# Patient Record
Sex: Female | Born: 2000 | Race: Black or African American | Hispanic: No | Marital: Single | Smoking: Never smoker
Health system: Southern US, Community
[De-identification: ages and names within clinical notes are randomized; demographics above are authoritative.]

## PROBLEM LIST (undated history)

## (undated) DIAGNOSIS — T7840XA Allergy, unspecified, initial encounter: Secondary | ICD-10-CM

## (undated) DIAGNOSIS — J020 Streptococcal pharyngitis: Secondary | ICD-10-CM

## (undated) DIAGNOSIS — L509 Urticaria, unspecified: Secondary | ICD-10-CM

## (undated) DIAGNOSIS — L309 Dermatitis, unspecified: Secondary | ICD-10-CM

## (undated) HISTORY — DX: Dermatitis, unspecified: L30.9

## (undated) HISTORY — DX: Urticaria, unspecified: L50.9

## (undated) HISTORY — DX: Allergy, unspecified, initial encounter: T78.40XA

---
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
## 2019-12-25 ENCOUNTER — Ambulatory Visit: Payer: Medicaid Other | Attending: Internal Medicine

## 2019-12-25 DIAGNOSIS — Z23 Encounter for immunization: Secondary | ICD-10-CM

## 2019-12-25 NOTE — Progress Notes (Signed)
   Covid-19 Vaccination Clinic  Name:  Lisa Brandt    MRN: [id number] DOB: 09-07-2000  12/25/2019  [name] was observed post Covid-19 immunization for 15 minutes without incident. She was provided with Vaccine Information Sheet and instruction to access the V-Safe system.   [name] was instructed to call 911 with any severe reactions post vaccine: Marland Kitchen Difficulty breathing  . Swelling of face and throat  . A fast heartbeat  . A bad rash all over body  . Dizziness and weakness   Immunizations Administered    Name Date Dose VIS Date Route   Pfizer COVID-19 Vaccine 12/25/2019 11:40 AM 0.3 mL 05/09/2018 Intramuscular   Manufacturer: [employer], [employer]   Lot: [id number]   NDC: [address]-8

## 2020-01-15 ENCOUNTER — Ambulatory Visit: Payer: Medicaid Other | Attending: Internal Medicine

## 2020-01-15 DIAGNOSIS — Z23 Encounter for immunization: Secondary | ICD-10-CM

## 2020-01-15 NOTE — Progress Notes (Signed)
   Covid-19 Vaccination Clinic  Name:  Appolonia Ackert    MRN: [id number] DOB: [birth year]  01/15/2020  Ms. Brienza was observed post Covid-19 immunization for 30 minutes based on pre-vaccination screening without incident. She was provided with Vaccine Information Sheet and instruction to access the V-Safe system.   Ms. Truax was instructed to call 911 with any severe reactions post vaccine: Marland Kitchen Difficulty breathing  . Swelling of face and throat  . A fast heartbeat  . A bad rash all over body  . Dizziness and weakness   Immunizations Administered    Name Date Dose VIS Date Route   Pfizer COVID-19 Vaccine 01/15/2020  2:21 PM 0.3 mL 01/02/2020 Intramuscular   Manufacturer: [employer], [employer]   Lot: [id number]   NDC: [address]-6

## 2020-01-15 NOTE — Progress Notes (Signed)
Patient

## 2020-02-28 DIAGNOSIS — J36 Peritonsillar abscess: Secondary | ICD-10-CM

## 2020-02-28 HISTORY — DX: Peritonsillar abscess: J36

## 2020-03-30 ENCOUNTER — Observation Stay (HOSPITAL_COMMUNITY)
Admission: EM | Admit: 2020-03-30 | Discharge: 2020-03-31 | Disposition: A | Discharge status: Home / Self Care | Payer: Medicaid Other | Attending: Internal Medicine | Admitting: Internal Medicine

## 2020-03-30 ENCOUNTER — Encounter (HOSPITAL_COMMUNITY): Payer: Self-pay | Admitting: Emergency Medicine

## 2020-03-30 ENCOUNTER — Emergency Department (HOSPITAL_COMMUNITY)
Admission: EM | Admit: 2020-03-30 | Discharge: 2020-03-30 | Disposition: A | Discharge status: Home / Self Care | Payer: Medicaid Other | Source: Home / Self Care

## 2020-03-30 ENCOUNTER — Ambulatory Visit (INDEPENDENT_AMBULATORY_CARE_PROVIDER_SITE_OTHER)
Admission: EM | Admit: 2020-03-30 | Discharge: 2020-03-30 | Disposition: A | Discharge status: Nursing Home (Includes ICF) | Payer: Medicaid Other | Source: Home / Self Care

## 2020-03-30 ENCOUNTER — Other Ambulatory Visit: Payer: Self-pay

## 2020-03-30 ENCOUNTER — Emergency Department (HOSPITAL_COMMUNITY): Payer: Medicaid Other

## 2020-03-30 ENCOUNTER — Encounter (HOSPITAL_COMMUNITY): Payer: Self-pay | Admitting: *Deleted

## 2020-03-30 DIAGNOSIS — Z20822 Contact with and (suspected) exposure to covid-19: Secondary | ICD-10-CM | POA: Diagnosis not present

## 2020-03-30 DIAGNOSIS — J36 Peritonsillar abscess: Secondary | ICD-10-CM

## 2020-03-30 DIAGNOSIS — Z5321 Procedure and treatment not carried out due to patient leaving prior to being seen by health care provider: Secondary | ICD-10-CM | POA: Insufficient documentation

## 2020-03-30 DIAGNOSIS — J029 Acute pharyngitis, unspecified: Secondary | ICD-10-CM | POA: Insufficient documentation

## 2020-03-30 HISTORY — DX: Streptococcal pharyngitis: J02.0

## 2020-03-30 LAB — CBC WITH DIFFERENTIAL/PLATELET
Abs Immature Granulocytes: 0.07 10*3/uL (ref 0.00–0.07)
Basophils Absolute: 0 10*3/uL (ref 0.0–0.1)
Basophils Relative: 0 %
Eosinophils Absolute: 0 10*3/uL (ref 0.0–0.5)
Eosinophils Relative: 0 %
HCT: 42.7 % (ref 36.0–46.0)
Hemoglobin: 13.7 g/dL (ref 12.0–15.0)
Immature Granulocytes: 0 %
Lymphocytes Relative: 6 %
Lymphs Abs: 1 10*3/uL (ref 0.7–4.0)
MCH: 26.2 pg (ref 26.0–34.0)
MCHC: 32.1 g/dL (ref 30.0–36.0)
MCV: 81.8 fL (ref 80.0–100.0)
Monocytes Absolute: 1.2 10*3/uL — ABNORMAL HIGH (ref 0.1–1.0)
Monocytes Relative: 8 %
Neutro Abs: 13.9 10*3/uL — ABNORMAL HIGH (ref 1.7–7.7)
Neutrophils Relative %: 86 %
Platelets: 266 10*3/uL (ref 150–400)
RBC: 5.22 MIL/uL — ABNORMAL HIGH (ref 3.87–5.11)
RDW: 14.1 % (ref 11.5–15.5)
WBC: 16.3 10*3/uL — ABNORMAL HIGH (ref 4.0–10.5)
nRBC: 0 % (ref 0.0–0.2)

## 2020-03-30 LAB — BASIC METABOLIC PANEL
Anion gap: 12 (ref 5–15)
BUN: <5 mg/dL — ABNORMAL LOW (ref 6–20)
CO2: 23 mmol/L (ref 22–32)
Calcium: 9.2 mg/dL (ref 8.9–10.3)
Chloride: 101 mmol/L (ref 98–111)
Creatinine, Ser: 0.85 mg/dL (ref 0.44–1.00)
GFR, Estimated: >60 mL/min (ref >60)
Glucose, Bld: 112 mg/dL — ABNORMAL HIGH (ref 70–99)
Potassium: 3.5 mmol/L (ref 3.5–5.1)
Sodium: 136 mmol/L (ref 135–145)

## 2020-03-30 LAB — POCT RAPID STREP A, ED / UC: Streptococcus, Group A Screen (Direct): NEGATIVE

## 2020-03-30 LAB — GROUP A STREP BY PCR: Group A Strep by PCR: DETECTED — AB

## 2020-03-30 LAB — I-STAT BETA HCG BLOOD, ED (MC, WL, AP ONLY): I-stat hCG, quantitative: <5 m[IU]/mL (ref <5)

## 2020-03-30 MED ORDER — ENOXAPARIN SODIUM 40 MG/0.4ML ~~LOC~~ SOLN: 40.0000 mg | SUBCUTANEOUS | Status: DC

## 2020-03-30 MED ORDER — IOHEXOL 300 MG/ML  SOLN
73.0000 mL | Freq: Once | INTRAMUSCULAR | Status: AC | PRN
  Administered 2020-03-30: 73 mL via INTRAVENOUS

## 2020-03-30 MED ORDER — ACETAMINOPHEN 650 MG RE SUPP: 650.0000 mg | Freq: Four times a day (QID) | RECTAL | Status: DC | PRN

## 2020-03-30 MED ORDER — KETOROLAC TROMETHAMINE 15 MG/ML IJ SOLN: 15.0000 mg | Freq: Three times a day (TID) | INTRAMUSCULAR | Status: DC | PRN

## 2020-03-30 MED ORDER — SODIUM CHLORIDE 0.9% FLUSH
3.0000 mL | Freq: Two times a day (BID) | INTRAVENOUS | Status: DC
  Administered 2020-03-31: 3 mL via INTRAVENOUS

## 2020-03-30 MED ORDER — KETOROLAC TROMETHAMINE 30 MG/ML IJ SOLN
30.0000 mg | Freq: Once | INTRAMUSCULAR | Status: AC
  Administered 2020-03-30: 30 mg via INTRAVENOUS
  Filled 2020-03-30: qty 1

## 2020-03-30 MED ORDER — DEXAMETHASONE SODIUM PHOSPHATE 10 MG/ML IJ SOLN
10.0000 mg | Freq: Once | INTRAMUSCULAR | Status: AC
  Administered 2020-03-30: 10 mg via INTRAVENOUS
  Filled 2020-03-30: qty 1

## 2020-03-30 MED ORDER — CLINDAMYCIN PHOSPHATE 600 MG/50ML IV SOLN
600.0000 mg | Freq: Three times a day (TID) | INTRAVENOUS | Status: DC
  Administered 2020-03-31 (×2): 600 mg via INTRAVENOUS
  Filled 2020-03-30 (×2): qty 50

## 2020-03-30 MED ORDER — ACETAMINOPHEN 500 MG PO TABS
1000.0000 mg | ORAL_TABLET | Freq: Once | ORAL | Status: AC
  Administered 2020-03-30: 1000 mg via ORAL
  Filled 2020-03-30: qty 2

## 2020-03-30 MED ORDER — ACETAMINOPHEN 325 MG PO TABS: 650.0000 mg | ORAL_TABLET | Freq: Four times a day (QID) | ORAL | Status: DC | PRN

## 2020-03-30 MED ORDER — POLYETHYLENE GLYCOL 3350 17 G PO PACK: 17.0000 g | PACK | Freq: Every day | ORAL | Status: DC | PRN

## 2020-03-30 MED ORDER — ONDANSETRON HCL 4 MG PO TABS: 4.0000 mg | ORAL_TABLET | Freq: Four times a day (QID) | ORAL | Status: DC | PRN

## 2020-03-30 MED ORDER — ONDANSETRON HCL 4 MG/2ML IJ SOLN: 4.0000 mg | Freq: Four times a day (QID) | INTRAMUSCULAR | Status: DC | PRN

## 2020-03-30 MED ORDER — LIDOCAINE VISCOUS HCL 2 % MT SOLN
15.0000 mL | Freq: Once | OROMUCOSAL | Status: AC
  Administered 2020-03-30: 15 mL via OROMUCOSAL
  Filled 2020-03-30: qty 15

## 2020-03-30 MED ORDER — CLINDAMYCIN PHOSPHATE 600 MG/50ML IV SOLN
600.0000 mg | Freq: Once | INTRAVENOUS | Status: AC
  Administered 2020-03-30: 600 mg via INTRAVENOUS
  Filled 2020-03-30: qty 50

## 2020-03-30 NOTE — ED Triage Notes (Signed)
Pt presents to ED POv. Pt c/o sore throat x3d. Denies cough, fevers, SOB. Airway intact

## 2020-03-30 NOTE — ED Triage Notes (Signed)
C/o sore throat x 3 days.  Sent from [facility] for peritonsillar abscess.

## 2020-03-30 NOTE — ED Notes (Signed)
Patient transported to CT 

## 2020-03-30 NOTE — ED Triage Notes (Signed)
C/O sore throat onset 3 days ago without fevers.  Denies any other sxs.  States feels like past strep infections.

## 2020-03-30 NOTE — ED Notes (Signed)
Pt given a cup of applesauce.  

## 2020-03-30 NOTE — ED Notes (Signed)
Called for pt, no response

## 2020-03-30 NOTE — ED Provider Notes (Signed)
[facility] EMERGENCY DEPARTMENT Provider Note   CSN: [id number] Arrival date & time: 03/30/20  1106     History Chief Complaint  Patient presents with  . Sore Throat    Lisa Brandt is a 20 y.o. female.  Lisa Brandt is a 20 y.o. female with tree of strep pharyngitis, who presents to the emergency department for evaluation of 3 days of sore throat.  She initially presented to the emergency department last night but left due to long wait times, did have positive strep PCR.  She returned to urgent care today when she was having worsening throat pain and difficulty swallowing.  She reports it is very painful to swallow she has been trying to spit out her saliva.  She has been able to drink some but reports it is becoming increasingly uncomfortable.  Patient did not know that she had a fever but was found to be febrile to 101 on arrival.  She reports history of previous strep infections that have felt similar.  No prior history of peritonsillar abscess.  No associated cough, rhinorrhea, nausea or vomiting.  She was evaluated at urgent care, there was concern for increased swelling on the left side, and uvula noted to be deviated, sent to the ED with concern for peritonsillar abscess.  Patient denies any shortness of breath or difficulty breathing.  No relief with over-the-counter medications at home.        Past Medical History:  Diagnosis Date  . Strep pharyngitis     Patient Active Problem List   Diagnosis Date Noted  . Peritonsillar abscess 03/30/2020    History reviewed. No pertinent surgical history.   OB History   No obstetric history on file.     Family History  Problem Relation Age of Onset  . Healthy Mother   . Healthy Father     Social History   Tobacco Use  . Smoking status: Never Smoker  . Smokeless tobacco: Never Used  Vaping Use  . Vaping Use: Never used  Substance Use Topics  . Alcohol use: Not Currently  . Drug use: Never     Home Medications Prior to Admission medications   Medication Sig Start Date End Date Taking? Authorizing Provider  acetaminophen (TYLENOL) 325 MG tablet Take 650 mg by mouth every 6 (six) hours as needed for mild pain or headache.   Yes [provider]  ELDERBERRY PO Take 1 each by mouth in the morning, at noon, and at bedtime.   Yes [provider]  Phenylephrine-Pheniramine-DM (THERAFLU COLD & COUGH PO) Take 30 mLs by mouth 3 (three) times daily as needed (flu symptoms).   Yes [provider]    Allergies    Penicillins  Review of Systems   Review of Systems  Constitutional: Positive for chills and fever.  HENT: Positive for sore throat and trouble swallowing. Negative for congestion and rhinorrhea.   Respiratory: Negative for cough and shortness of breath.   Cardiovascular: Negative for chest pain.  Gastrointestinal: Negative for abdominal pain, nausea and vomiting.  Musculoskeletal: Negative for myalgias, neck pain and neck stiffness.  Skin: Negative for rash.  Neurological: Negative for headaches.  All other systems reviewed and are negative.   Physical Exam Updated Vital Signs BP 125/77   Pulse 68   Temp 98.9 F (37.2 C) (Oral)   Resp 16   Ht 5\' 7"  (1.702 m)   Wt 104.3 kg   LMP 03/09/2020 (Exact Date)   SpO2 99%  BMI 36.02 kg/m   Physical Exam Vitals and nursing note reviewed.  Constitutional:      General: She is not in acute distress.    Appearance: She is well-developed, normal weight and well-nourished. She is not ill-appearing or diaphoretic.     Comments: Currently well-appearing and in no acute distress.  HENT:     Head: Normocephalic and atraumatic.     Nose: No congestion or rhinorrhea.     Mouth/Throat:     Pharynx: Uvula midline. Pharyngeal swelling, oropharyngeal exudate and posterior oropharyngeal erythema present.     Tonsils: Tonsillar exudate present. 4+ on the right. 4+ on the left.     Comments: Posterior  oropharynx is erythematous with bilateral significant tonsillar edema, uvula appears midline but patient has kissing tonsils, erythema and exudates noted bilaterally.  Patient is spitting secretions into a bag but has normal phonation with no trismus.  Protecting airway. Eyes:     General:        Right eye: No discharge.        Left eye: No discharge.  Neck:     Comments: There is some mild cervical lymphadenopathy noted.  No stridor or rigidity Cardiovascular:     Rate and Rhythm: Normal rate and regular rhythm.     Heart sounds: Normal heart sounds.  Pulmonary:     Effort: Pulmonary effort is normal. No respiratory distress.     Breath sounds: Normal breath sounds.     Comments: Respirations equal and unlabored, patient able to speak in full sentences, lungs clear to auscultation bilaterally  Abdominal:     General: Bowel sounds are normal. There is no distension.     Palpations: There is no mass.  Musculoskeletal:     Cervical back: Neck supple.  Skin:    General: Skin is warm and dry.     Findings: No rash.  Neurological:     Mental Status: She is alert and oriented to person, place, and time.     Coordination: Coordination normal.  Psychiatric:        Mood and Affect: Mood and affect and mood normal.        Behavior: Behavior normal.     ED Results / Procedures / Treatments   Labs (all labs ordered are listed, but only abnormal results are displayed) Labs Reviewed  BASIC METABOLIC PANEL - Abnormal; Notable for the following components:      Result Value   Glucose, Bld 112 (*)    BUN <5 (*)    All other components within normal limits  CBC WITH DIFFERENTIAL/PLATELET - Abnormal; Notable for the following components:   WBC 16.3 (*)    RBC 5.22 (*)    Neutro Abs 13.9 (*)    Monocytes Absolute 1.2 (*)    All other components within normal limits  I-STAT BETA HCG BLOOD, ED ([facility], [facility], [facility] ONLY)    EKG None  Radiology None  Procedures Procedures (including critical  care time)  Medications Ordered in ED Medications  clindamycin (CLEOCIN) IVPB 600 mg (0 mg Intravenous Stopped 03/30/20 1806)  dexamethasone (DECADRON) injection 10 mg (10 mg Intravenous Given 03/30/20 1650)  lidocaine (XYLOCAINE) 2 % viscous mouth solution 15 mL (15 mLs Mouth/Throat Given 03/30/20 1649)  ketorolac (TORADOL) 30 MG/ML injection 30 mg (30 mg Intravenous Given 03/30/20 1650)  iohexol (OMNIPAQUE) 300 MG/ML solution 73 mL (73 mLs Intravenous Contrast Given 03/30/20 1712)  acetaminophen (TYLENOL) tablet 1,000 mg (1,000 mg Oral Given 03/30/20 1947)  ED Course  I have reviewed the triage vital signs and the nursing notes.  Pertinent labs & imaging results that were available during my care of the patient were reviewed by me and considered in my medical decision making (see chart for details).    MDM Rules/Calculators/A&P                         20 year old female sent from urgent care for evaluation for possible peritonsillar abscess.  She has had 3 days of sore throat, had positive strep test in the ED last night but did not stay for evaluation due to wait times.  Returns today with pain with swallowing, pain worse on the left side of the throat.  Urgent care was concerned for left PTA.  On my evaluation patient does have significant bilateral peritonsillar edema with kissing tonsils, exudates present bilaterally, difficulty tolerating secretions and spitting them into a bag but no stridor and is currently protecting her airway and able to talk without difficulty.  We will get basic labs and CT soft tissue neck with contrast.  We will treat with IV Toradol, Decadron, clindamycin and viscous lidocaine.  Lab work has been reviewed.  Significant for leukocytosis of 16.3, glucose of 112 but no other significant electrolyte derangements, negative pregnancy.  At shift change CT soft tissue neck is still pending, care signed out to PA Graciella Freer who will follow-up on CT results, if this  does show peritonsillar abscess will need ENT consult, could also be severe bilateral tonsillitis.  PA Layden we will follow-up on pending results and disposition appropriately  Final Clinical Impression(s) / ED Diagnoses Final diagnoses:  Strep pharangitis    Rx / DC Orders ED Discharge Orders    None       Dartha Lodge, PA-C 03/31/20 0001    Virgina Norfolk, DO 03/31/20 1558

## 2020-03-30 NOTE — ED Provider Notes (Signed)
[facility]    CSN: [id number] Arrival date & time: 03/30/20  1009      History   Chief Complaint Chief Complaint  Patient presents with  . Sore Throat    HPI Lisa Brandt is a 20 y.o. female presenting for sore throat for 3 days. States significant pain at rest and with swallowing. Denies shortness of breath, congestion, cough, difficulty swallowing, fevers, n/v/d, ear pain, etc.  States she had a negative covid test at onset of symptoms. Has been trying over the counter medications with no relief  States she's concerned for strep throat  HPI  Past Medical History:  Diagnosis Date  . Strep pharyngitis     There are no problems to display for this patient.   History reviewed. No pertinent surgical history.  OB History   No obstetric history on file.      Home Medications    Prior to Admission medications   Not on File    Family History Family History  Problem Relation Age of Onset  . Healthy Mother   . Healthy Father     Social History Social History   Tobacco Use  . Smoking status: Never Smoker  . Smokeless tobacco: Never Used  Vaping Use  . Vaping Use: Never used  Substance Use Topics  . Alcohol use: Not Currently  . Drug use: Never     Allergies   Penicillins   Review of Systems Review of Systems  Constitutional: Negative for appetite change, chills and fever.  HENT: Positive for sore throat. Negative for congestion, ear pain, rhinorrhea, sinus pressure and sinus pain.   Eyes: Negative for redness and visual disturbance.  Respiratory: Negative for cough, chest tightness, shortness of breath and wheezing.   Cardiovascular: Negative for chest pain and palpitations.  Gastrointestinal: Negative for abdominal pain, constipation, diarrhea, nausea and vomiting.  Genitourinary: Negative for dysuria, frequency and urgency.  Musculoskeletal: Negative for myalgias.  Neurological: Negative for dizziness, weakness and headaches.   Psychiatric/Behavioral: Negative for confusion.  All other systems reviewed and are negative.    Physical Exam Triage Vital Signs ED Triage Vitals  Enc Vitals Group     BP      Pulse      Resp      Temp      Temp src      SpO2      Weight      Height      Head Circumference      Peak Flow      Pain Score      Pain Loc      Pain Edu?      Excl. in GC?    No data found.  Updated Vital Signs BP (!) 155/88   Pulse (!) 103   Temp 98.9 F (37.2 C) (Oral)   Resp 16   LMP 03/09/2020 (Exact Date)   SpO2 98%   Visual Acuity Right Eye Distance:   Left Eye Distance:   Bilateral Distance:    Right Eye Near:   Left Eye Near:    Bilateral Near:     Physical Exam Vitals reviewed.  Constitutional:      General: She is not in acute distress.    Appearance: Normal appearance. She is not ill-appearing.  HENT:     Head: Normocephalic and atraumatic.     Right Ear: Hearing, tympanic membrane, ear canal and external ear normal. No swelling or tenderness. There is no impacted cerumen. No mastoid  tenderness. Tympanic membrane is not perforated, erythematous, retracted or bulging.     Left Ear: Hearing, tympanic membrane, ear canal and external ear normal. No swelling or tenderness. There is no impacted cerumen. No mastoid tenderness. Tympanic membrane is not perforated, erythematous, retracted or bulging.     Nose:     Right Sinus: No maxillary sinus tenderness or frontal sinus tenderness.     Left Sinus: No maxillary sinus tenderness or frontal sinus tenderness.     Mouth/Throat:     Mouth: Mucous membranes are moist.     Pharynx: Pharyngeal swelling and posterior oropharyngeal erythema present. No oropharyngeal exudate.     Tonsils: No tonsillar exudate.     Comments: L posterior pharynx with erythema and swelling. Uvula not midline.  Cardiovascular:     Rate and Rhythm: Normal rate and regular rhythm.     Heart sounds: Normal heart sounds.  Pulmonary:     Breath sounds:  Normal breath sounds and air entry. No wheezing, rhonchi or rales.  Chest:     Chest wall: No tenderness.  Abdominal:     General: Abdomen is flat. Bowel sounds are normal.     Tenderness: There is no abdominal tenderness. There is no guarding or rebound.  Lymphadenopathy:     Cervical: No cervical adenopathy.  Neurological:     General: No focal deficit present.     Mental Status: She is alert and oriented to person, place, and time.  Psychiatric:        Attention and Perception: Attention and perception normal.        Mood and Affect: Mood and affect normal.        Behavior: Behavior normal. Behavior is cooperative.        Thought Content: Thought content normal.        Judgment: Judgment normal.      UC Treatments / Results  Labs (all labs ordered are listed, but only abnormal results are displayed) Labs Reviewed  CULTURE, GROUP A STREP [facility])  POCT RAPID STREP A, ED / UC    EKG   Radiology No results found.  Procedures Procedures (including critical care time)  Medications Ordered in UC Medications - No data to display  Initial Impression / Assessment and Plan / UC Course  I have reviewed the triage vital signs and the nursing notes.  Pertinent labs & imaging results that were available during my care of the patient were reviewed by me and considered in my medical decision making (see chart for details).     Head straight to [facility] ED for further evaluation of peritonsillar abscess. Pt is hemodynamically stable to transport herself at this time. She verbalizes that she will head straight there. She understands to call EMS if shortness of breath, chest pain, syncope, etc while on her way to ED  Rapid strep negative. Culture sent, negative. Strep PCR performed by hospital was positive for strep. This patient was admitted to the hospital for overnight observation. Patient declines covid test stating that she had a negative covid test 3 days ago    Final  Clinical Impressions(s) / UC Diagnoses   Final diagnoses:  Peritonsillar abscess     Discharge Instructions     -Head straight to [facility] ER    ED Prescriptions    None     PDMP not reviewed this encounter.   Rhys Martini, PA-C 04/01/20 [phone]

## 2020-03-30 NOTE — Consult Note (Signed)
Reason for Consult: Peritonsillar abscess Referring Physician: ER  Lisa Brandt is an 20 y.o. female.  HPI: History of sore throat since Thursday.  She has had increasing symptoms beginning yesterday.  She still able to swallow.  She has been in the emergency room and had antibiotics and already feels better.  Her voice is much less muffled.  She had no airway problems.  She has no chronic tonsillitis issues.  No previous peritonsillar abscess.  CT scan shows two small areas in the left tonsil of suspected abscess.  Past Medical History:  Diagnosis Date  . Strep pharyngitis     History reviewed. No pertinent surgical history.  Family History  Problem Relation Age of Onset  . Healthy Mother   . Healthy Father     Social History:  reports that she has never smoked. She has never used smokeless tobacco. She reports previous alcohol use. She reports that she does not use drugs.  Allergies:  Allergies  Allergen Reactions  . Penicillins Hives    Medications: I have reviewed the patient's current medications.  Results for orders placed or performed during the hospital encounter of 03/30/20 (from the past 48 hour(s))  Basic metabolic panel     Status: Abnormal   Collection Time: 03/30/20  3:44 PM  Result Value Ref Range   Sodium 136 135 - 145 mmol/L   Potassium 3.5 3.5 - 5.1 mmol/L   Chloride 101 98 - 111 mmol/L   CO2 23 22 - 32 mmol/L   Glucose, Bld 112 (H) 70 - 99 mg/dL    Comment: Glucose reference range applies only to samples taken after fasting for at least 8 hours.   BUN <5 (L) 6 - 20 mg/dL   Creatinine, Ser 1.51 0.44 - 1.00 mg/dL   Calcium 9.2 8.9 - 76.1 mg/dL   GFR, Estimated >60 >73 mL/min    Comment: (NOTE) Calculated using the CKD-EPI Creatinine Equation (2021)    Anion gap 12 5 - 15    Comment: Performed at [facility], [address] [place], [place] [address]  CBC with Differential     Status: Abnormal   Collection Time: 03/30/20  3:44 PM  Result  Value Ref Range   WBC 16.3 (H) 4.0 - 10.5 K/uL   RBC 5.22 (H) 3.87 - 5.11 MIL/uL   Hemoglobin 13.7 12.0 - 15.0 g/dL   HCT 69.4 85.4 - 62.7 %   MCV 81.8 80.0 - 100.0 fL   MCH 26.2 26.0 - 34.0 pg   MCHC 32.1 30.0 - 36.0 g/dL   RDW 03.5 00.9 - 38.1 %   Platelets 266 150 - 400 K/uL   nRBC 0.0 0.0 - 0.2 %   Neutrophils Relative % 86 %   Neutro Abs 13.9 (H) 1.7 - 7.7 K/uL   Lymphocytes Relative 6 %   Lymphs Abs 1.0 0.7 - 4.0 K/uL   Monocytes Relative 8 %   Monocytes Absolute 1.2 (H) 0.1 - 1.0 K/uL   Eosinophils Relative 0 %   Eosinophils Absolute 0.0 0.0 - 0.5 K/uL   Basophils Relative 0 %   Basophils Absolute 0.0 0.0 - 0.1 K/uL   Immature Granulocytes 0 %   Abs Immature Granulocytes 0.07 0.00 - 0.07 K/uL    Comment: Performed at [facility], [address], [place], [place] [address]  I-Stat beta hCG blood, ED     Status: None   Collection Time: 03/30/20  3:55 PM  Result Value Ref Range  I-stat hCG, quantitative <5.0 <5 mIU/mL   Comment 3            Comment:   GEST. AGE      CONC.  (mIU/mL)   <=1 WEEK        5 - 50     2 WEEKS       50 - 500     3 WEEKS       100 - 10,000     4 WEEKS     1,000 - 30,000        FEMALE AND NON-PREGNANT FEMALE:     LESS THAN 5 mIU/mL     CT Soft Tissue Neck W Contrast  Result Date: 03/30/2020 CLINICAL DATA:  Tonsil/adenoid disorder. Question peritonsillar abscess. EXAM: CT NECK WITH CONTRAST TECHNIQUE: Multidetector CT imaging of the neck was performed using the standard protocol following the bolus administration of intravenous contrast. CONTRAST:  34mL OMNIPAQUE IOHEXOL 300 MG/ML  SOLN COMPARISON:  None. FINDINGS: Pharynx and larynx: Bilateral tonsillar enlargement and striated/heterogeneous enhancement, compatible with tonsillitis. There is a 1.0 by 1.7 by 1.3 cm left peritonsillar fluid collection, compatible with abscess (see series 3, image 48 and series 7, image 59). There is an adjacent smaller 6 mm fluid collection more anteriorly and  superiorly, compatible with an additional abscess (see series 3, image 43). Stranding/edema extends inferiorly to the base of the epiglottis, involving the left area epiglottic fold. Edema extends laterally to involve the left parapharyngeal space and left submandibular space. Change in there is associated mass effect on the airway, which remains patent. Prominent adenoid soft tissue, most likely adenoid hypertrophy. Salivary glands: Inflammatory changes from the above process extending to involve the left submandibular space and surround the left submandibular gland. The submandibular gland itself appears unremarkable and symmetric. The parotid glands are normal. Thyroid: Normal. Lymph nodes: Bilateral upper cervical lymphadenopathy without specific evidence of suppuration. Vascular: Grossly patent. Limited intracranial: Negative. Visualized orbits: Negative. Mastoids and visualized paranasal sinuses: Mucosal thickening of the inferior right maxillary sinus. Remaining sinuses are largely clear. No mastoid effusions. Skeleton: No acute or aggressive process. Upper chest: Negative. IMPRESSION: 1. Findings consistent with tonsillitis with two left peritonsillar abscesses, as detailed above. Edema extends inferiorly to involve the base of the left epiglottis/aryepiglottic fold and inferolaterally to involve the parapharyngeal and submandibular spaces. Associated mass effect on the pharyngeal airway, which remains patent. 2. Cervical lymphadenopathy without evidence of suppuration. 3. Prominent adenoid soft tissue. This likely represents adenoid hypertrophy; however, recommend correlation with direct inspection. Electronically Signed   By: Feliberto Harts MD   On: 03/30/2020 17:31    ROS Blood pressure 123/70, pulse 81, temperature (!) 101 F (38.3 C), temperature source Oral, resp. rate 18, height 5\' 7"  (1.702 m), weight 104.3 kg, last menstrual period 03/09/2020, SpO2 97 %. Physical Exam Constitutional:       Appearance: She is well-developed.  HENT:     Head: Normocephalic.     Comments: She is awake and alert.  No distress.  No stridor.  She has no trismus.  She is handling her secretions well.  Nose is clear.  Oral cavity/oropharynx-both tonsils have thick exudate on the surface.  The left side has a slight more erythema to the peritonsillar region but no significant bulging.  She opens her mouth well.  She has a normal sounding voice.  Neck is without swelling or tenderness      Assessment/Plan: Left peritonsillar abscess/acute tonsillitis-she has bilateral tonsillitis.  With  thick exudate.  There is a fluid collection on CT scan on the left side.  She seems to have no trismus, no airway problems, seems to be comfortable with no significant pain, and already feels better since she started treatment in the emergency room.  I would recommend medical therapy for now and see if she continues to improve.  I will follow-up with her in the morning to make a plan for incision and drainage or discharge.  Suzanna Obey 03/30/2020, 6:58 PM

## 2020-03-30 NOTE — ED Provider Notes (Signed)
  Care assumed from  [facility] PA-C at shift change with CT soft tissue neck pending.   In brief, this patient is a 20 year old female who presents for evaluation of 3 days of sore throat.  She reports that initially she woke up and thought that her throat was just scratchy.  She came to the ER last night when symptoms were getting worse.  She left before being seen.  This morning, she woke up and said that it felt worse, she said it hurt when she swallows her saliva and she is having to spit it out.  She went to urgent care was sent to the ED for further evaluation.  No difficulty breathing.   Physical Exam  BP 127/77   Pulse 81   Temp (!) 101 F (38.3 C) (Oral)   Resp 16   Ht 5\' 7"  (1.702 m)   Wt 104.3 kg   LMP 03/09/2020 (Exact Date)   SpO2 98%   BMI 36.02 kg/m   Physical Exam  Muffled phonation.  She is intermittently spitting into a bag but no active drooling.  No evidence of respiratory distress. Posterior oropharynx is erythematous.  Uvula is deviated to the right.  There is evidence of tonsillar edema, erythema noted on the left.  ED Course/Procedures     Procedures  MDM     PLAN: Patient has been given 10 of Decadron, 30 of Toradol, clindamycin.  She is pending a CT soft tissue neck for evaluation of peritonsillar abscess.  MDM: CBC shows leukocytosis of 16.3.  BUN and creatinine within normal limits.  I-STAT beta negative.   Review of records show that she did have a strep done yesterday that was positive.  CT scan shows tonsillitis with 2 left peritonsillar abscesses.  She has 1 that is 1 x 1.7 x 1.3 cm and then another one that is about 6 mm.  She has edema that extends inferiorly to the base of epiglottis involving the left area epiglottic fold.  Edema extends laterally to involve the left pharyngeal space and submandibular space.  Change and there is associated mass-effect on the airway which remains patent.  Discussed patient with Dr. 03/11/2020 (ENT) who  reviewed patient's scans. He will come evaluate the patient to see if the abscess needs to be drained. He recommends that given the edema, that patient be admitted overnight for observation.   Discussed patient with Dr. Jearld Fenton (ENT) after evaluation. He will hold off on draining the abscesses now. Wil treat with IV meds/abx. He recommends observation and he will consult on patient tomorrow.   Discussed with internal medicine team who accepts patient for admission.   1. Peritonsillar abscess     Portions of this note were generated with Dragon dictation software. Dictation errors may occur despite best attempts at proofreading.    Jearld Fenton, PA-C 03/30/20 1959    04/01/20, DO 03/31/20 1559

## 2020-03-30 NOTE — ED Notes (Signed)
Called for repeat VS, no response. 

## 2020-03-30 NOTE — Discharge Instructions (Addendum)
-  Head straight to [facility] ER

## 2020-03-30 NOTE — H&P (Addendum)
Date: 03/30/2020               Patient Name:  Lisa Brandt MRN: [id number]  DOB: [birth year] Age / Sex: 20 y.o., female   PCP: Patient, No Pcp Per         Medical Service: Internal Medicine Teaching Service         Attending Physician: Dr. Gust Rung, DO    First Contact: Dr. Alphonzo Severance Pager: [phone]  Second Contact: Dr. Dolan Amen Pager: [phone]       After Hours (After 5p/  First Contact Pager: [phone]  weekends / holidays): Second Contact Pager: [phone]   Chief Complaint: sore throat  History of Present Illness:   Lisa Brandt is a 20 year old female with no pertinent past medical history who presents for evaluation of sore throat x3 days. Reports that she woke up 4 days ago and felt her throat was scratchy and this progressively worsened to a sore throat that she states feels like prior strep throat infections. Tried OTC medications, but with minimal relief. Symptoms became severely worse yesterday and she came to the ED to be tested for strep, but wait was very long, so she went to urgent care. At the urgent care, she was told she had an abscess and sent back to the ED. She is having pain with swallowing but no difficulty. She denies any trouble breathing, cough, ear pain, N/V/D, abdominal pain.   No fever at home but was febrile on arrival.   Presented to the ER yesterday night after worsening of symptoms, but left before she was seen. This morning, she reports waking up with worsening symptoms and also began having pain with swallowing her saliva. She went to the [facility] and was sent to the ED for further evaluation of possible peritonsillar abscess.   She denies SHOB, cough, congestion, subjective fevers, nausea, vomiting, and diarrhea. She does report having a negative COVID test after the onset of symptoms.   ED course: CBC showing leukocytosis to 16.3, BMP unremarkable, I-stat hCG negative. Group A Strep by PCR is positive. CT soft tissue neck showing  tonsillitis with two left peritonsillar abscesses with edema extending inferiorly to involve the base of the left epiglottic fold and inferolaterally to involve the left pharyngeal space and submandibular space, with associated mass-effect on the airway which remains patent. Given Decadron 10mg , toradol 30mg , lidocaine mouth solution, one dose of clindamycin. ENT, Dr. , consulted for further evaluation, will hold off on draining for now and will treat with IV abx. IMTS consulted to admit for overnight observation.    Meds:  Current Meds  Medication Sig  . acetaminophen (TYLENOL) 325 MG tablet Take 650 mg by mouth every 6 (six) hours as needed for mild pain or headache.  ELDERBERRY PO Take 1 each by mouth in the morning, at noon, and at bedtime.  Jearld Fenton Phenylephrine-Pheniramine-DM (THERAFLU COLD & COUGH PO) Take 30 mLs by mouth 3 (three) times daily as needed (flu symptoms).     Allergies: Allergies as of 03/30/2020 - Review Complete 03/30/2020  Allergen Reaction Noted  . Penicillins Hives 01/15/2020   Past Medical History:  Diagnosis Date  . Strep pharyngitis     Family History:  Family History  Problem Relation Age of Onset  . Healthy Mother   . Healthy Father    Grandmother: Breast cancer   Social History: Social History   Socioeconomic History  . Marital status: Single    Spouse name:  Not on file  . Number of children: Not on file  . Years of education: Not on file  . Highest education level: Not on file  Occupational History  . Not on file  Tobacco Use  . Smoking status: Never Smoker  . Smokeless tobacco: Never Used  Vaping Use  . Vaping Use: Never used  Substance and Sexual Activity  . Alcohol use: Not Currently  . Drug use: Never  . Sexual activity: Yes    Birth control/protection: Condom  Other Topics Concern  . Not on file  Social History Narrative  . Not on file   Social Determinants of Health   Financial Resource Strain: Not on file  Food  Insecurity: Not on file  Transportation Needs: Not on file  Physical Activity: Not on file  Stress: Not on file  Social Connections: Not on file  Intimate Partner Violence: Not on file   Denies any alcohol use, tobacco use, drug use.  Student at [employer]   Review of Systems: A complete ROS was negative except as per HPI.   Physical Exam: Blood pressure 125/77, pulse 68, temperature 98.9 F (37.2 C), temperature source Oral, resp. rate 16, height 5\' 7"  (1.702 m), weight 104.3 kg, last menstrual period 03/09/2020, SpO2 99 %. Physical Exam Constitutional:      General: She is not in acute distress.    Appearance: She is obese. She is not ill-appearing.     Comments: Pleasant young female, sitting up in bed, NAD.  HENT:     Mouth/Throat:     Mouth: Mucous membranes are moist.     Tonsils: Tonsillar abscess present.     Comments: Bilateral tonsillar exudates, erythema in peritonsillar region bilaterally (L>R). No respiratory distress, no trismus. Is able to open mouth fully.  Eyes:     Conjunctiva/sclera: Conjunctivae normal.     Pupils: Pupils are equal, round, and reactive to light.  Cardiovascular:     Rate and Rhythm: Normal rate and regular rhythm.     Heart sounds: Normal heart sounds. No murmur heard. No friction rub. No gallop.   Pulmonary:     Effort: Pulmonary effort is normal. No respiratory distress.     Breath sounds: Normal breath sounds. No stridor. No wheezing, rhonchi or rales.  Abdominal:     General: Bowel sounds are normal. There is no distension.     Palpations: Abdomen is soft.     Tenderness: There is no abdominal tenderness.  Musculoskeletal:     Cervical back: Normal range of motion.  Skin:    General: Skin is warm and dry.  Neurological:     General: No focal deficit present.     Mental Status: She is alert and oriented to person, place, and time.  Psychiatric:        Mood and Affect: Mood normal.        Behavior: Behavior normal.     CT Soft  Tissue Neck W Contrast  Result Date: 03/30/2020 CLINICAL DATA:  Tonsil/adenoid disorder. Question peritonsillar abscess. EXAM: CT NECK WITH CONTRAST TECHNIQUE: Multidetector CT imaging of the neck was performed using the standard protocol following the bolus administration of intravenous contrast. CONTRAST:  22mL OMNIPAQUE IOHEXOL 300 MG/ML  SOLN COMPARISON:  None. FINDINGS: Pharynx and larynx: Bilateral tonsillar enlargement and striated/heterogeneous enhancement, compatible with tonsillitis. There is a 1.0 by 1.7 by 1.3 cm left peritonsillar fluid collection, compatible with abscess (see series 3, image 48 and series 7, image 59). There is an adjacent  smaller 6 mm fluid collection more anteriorly and superiorly, compatible with an additional abscess (see series 3, image 43). Stranding/edema extends inferiorly to the base of the epiglottis, involving the left area epiglottic fold. Edema extends laterally to involve the left parapharyngeal space and left submandibular space. Change in there is associated mass effect on the airway, which remains patent. Prominent adenoid soft tissue, most likely adenoid hypertrophy. Salivary glands: Inflammatory changes from the above process extending to involve the left submandibular space and surround the left submandibular gland. The submandibular gland itself appears unremarkable and symmetric. The parotid glands are normal. Thyroid: Normal. Lymph nodes: Bilateral upper cervical lymphadenopathy without specific evidence of suppuration. Vascular: Grossly patent. Limited intracranial: Negative. Visualized orbits: Negative. Mastoids and visualized paranasal sinuses: Mucosal thickening of the inferior right maxillary sinus. Remaining sinuses are largely clear. No mastoid effusions. Skeleton: No acute or aggressive process. Upper chest: Negative. IMPRESSION: 1. Findings consistent with tonsillitis with two left peritonsillar abscesses, as detailed above. Edema extends inferiorly to  involve the base of the left epiglottis/aryepiglottic fold and inferolaterally to involve the parapharyngeal and submandibular spaces. Associated mass effect on the pharyngeal airway, which remains patent. 2. Cervical lymphadenopathy without evidence of suppuration. 3. Prominent adenoid soft tissue. This likely represents adenoid hypertrophy; however, recommend correlation with direct inspection. Electronically Signed   By: Feliberto Harts MD   On: 03/30/2020 17:31    Assessment & Plan by Problem: Active Problems:   Peritonsillar abscess  Lisa Brandt is a 20 year old female with no pertinent past medical history presenting with sore throat x3 days and odynophagia with CT findings suggestive of 2 left-sided peritonsillar abscesses.  Peritonsillar abscess Presented with sore throat x3 days, odynophagia. CBC showing leukocytosis to 16.3. Group A Strep by PCR positive. CT soft tissue neck showing tonsillitis with two left peritonsillar abscesses (one that is 1 x 1.7 x 1.3 cm and one that is 52mm) with edema extending inferiorly to involve the base of the left epiglottic fold and inferolaterally to involve the left pharyngeal space and submandibular space, with associated mass-effect on the airway. In the ED, given Decadron 10mg , toradol 30mg , lidocaine mouth solution, one dose of clindamycin. Reports feeling a lot better now after medications, requesting diet.  -ENT consulted, will hold off on draining abscess at this time -as per ENT, treat with IV abx for now, will reevaluate tomorrow morning  -daily CBC -tylenol prn for fevers and mild pain -toradol 15mg  for moderate pain -IV clindamycin 600mg  q8h -IV zofran 4mg  q6h prn for nausea -if becomes acutely short of breath, may need emergent intubation for protection of airway -will start soft diet, can advance as tolerated   Dispo: Admit patient to Observation with expected length of stay less than 2 midnights.  Signed: ,  MD 03/30/2020, 10:20 PM  Pager: [phone] After 5pm on weekdays and 1pm on weekends: On Call pager: [phone]

## 2020-03-31 DIAGNOSIS — J36 Peritonsillar abscess: Secondary | ICD-10-CM | POA: Diagnosis not present

## 2020-03-31 LAB — CBC
HCT: 38.5 % (ref 36.0–46.0)
Hemoglobin: 13 g/dL (ref 12.0–15.0)
MCH: 26.8 pg (ref 26.0–34.0)
MCHC: 33.8 g/dL (ref 30.0–36.0)
MCV: 79.4 fL — ABNORMAL LOW (ref 80.0–100.0)
Platelets: 301 10*3/uL (ref 150–400)
RBC: 4.85 MIL/uL (ref 3.87–5.11)
RDW: 14.2 % (ref 11.5–15.5)
WBC: 15.3 10*3/uL — ABNORMAL HIGH (ref 4.0–10.5)
nRBC: 0 % (ref 0.0–0.2)

## 2020-03-31 LAB — SARS CORONAVIRUS 2 (TAT 6-24 HRS): SARS Coronavirus 2: NEGATIVE

## 2020-03-31 LAB — CULTURE, GROUP A STREP (THRC)

## 2020-03-31 LAB — HIV ANTIBODY (ROUTINE TESTING W REFLEX): HIV Screen 4th Generation wRfx: NONREACTIVE

## 2020-03-31 MED ORDER — ONDANSETRON HCL 4 MG PO TABS: 4.0000 mg | ORAL_TABLET | Freq: Four times a day (QID) | ORAL | 0 refills | Status: DC | PRN

## 2020-03-31 MED ORDER — AMOXICILLIN-POT CLAVULANATE 875-125 MG PO TABS: 1.0000 | ORAL_TABLET | Freq: Two times a day (BID) | ORAL | 0 refills | Status: DC

## 2020-03-31 NOTE — Progress Notes (Signed)
Patient is discharged from room 3C06 at this time. Alert and in stable condition. IV d/c'd and instructions read to patient with understanding verbalized and all questions answered. Left unit via wheelchair with all belongings at side.

## 2020-03-31 NOTE — Progress Notes (Signed)
     Subjective:   No acute events overnight.  During evaluation this morning, the patient states she is feeling much better, throat pain is improved, and she is eating well. Discussed plan for her to discharge home on oral antibiotics with ENT follow-up.  Objective:   Vital signs in last 24 hours: Vitals:   03/31/20 0030 03/31/20 0045 03/31/20 0248 03/31/20 0319  BP: 118/72 (!) 104/54 114/72 118/76  Pulse: 72 (!) 57 67 62  Resp:  17 18 20   Temp:   98.1 F (36.7 C) 98.5 F (36.9 C)  TempSrc:   Oral Oral  SpO2: 98% 98% 100% 99%  Weight:      Height:       Supplemental O2: None  Physical Exam Constitutional: very pleasant and well-appearing woman sitting upright in bed, in no acute distress HENT: normocephalic atraumatic, mucous membranes moist, bilateral tonsillar exudates, erythema in peritonsillar region bilaterally (L>R). No respiratory distress or trismus. Cardiovascular: regular rate and rhythm, no m/r/g Pulmonary/Chest: normal work of breathing on room air, lungs clear to auscultation bilaterally  Pertinent Labs: CBC Latest Ref Rng & Units 03/31/2020 03/30/2020  WBC 4.0 - 10.5 K/uL 15.3(H) 16.3(H)  Hemoglobin 12.0 - 15.0 g/dL 04/01/2020 54.6  Hematocrit 27.0 - 46.0 % 38.5 42.7  Platelets 150 - 400 K/uL 301 266    CMP Latest Ref Rng & Units 03/30/2020  Glucose 70 - 99 mg/dL 04/01/2020)  BUN 6 - 20 mg/dL 093(G)  Creatinine <1(W - 1.00 mg/dL 2.99  Sodium 3.71 - 696 mmol/L 136  Potassium 3.5 - 5.1 mmol/L 3.5  Chloride 98 - 111 mmol/L 101  CO2 22 - 32 mmol/L 23  Calcium 8.9 - 10.3 mg/dL 9.2    Imaging: none   Assessment/Plan:   Active Problems:   Peritonsillar abscess   Patient Summary: Lisa Brandt is a 20 y.o. woman with no pertinent past medical history presenting with sore throat x3 days and odynophagia with CT findings suggestive of 2 left-sided peritonsillar abscesses.  This is hospital day 1.   Left peritonsillar abscess/acute tonsilittis Improvement in  pain and swelling this morning, tolerating a diet. No trismus or difficulty breathing. Leukocytosis 16.3->15.3. Also evaluated by ENT this morning who recommend discharge on Augmentin with outpatient ENT follow-up if not resolved in a few days.  - ENT consulted, appreciate their expertise - Per ENT, discharge home with 14-day course of Augmentin. 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 Spoke with patient who reports she cannot recall her reaction. Discussed possibility of developing hives on Augmentin and recommendation to use Benadryl should that occur. Seek immediate medical care if develops respiratory distress. - PO zofran 4mg  q6h prn for nausea - Patient instructed to ENT follow-up in a few days should her swelling not resolve  Primary care needs - Patient is agreeable to hospital follow-up and potential establishment of care at [facility]  Diet: Soft IVF: None VTE: Enoxaparin Code: Full   Dispo: Anticipated discharge to Home today.   Please contact the on call pager after 5 pm and on weekends at [phone].  [facility], MD PGY-1 Internal Medicine Teaching Service Pager: [phone] 03/31/2020

## 2020-03-31 NOTE — Progress Notes (Signed)
   Subjective:    Patient ID: Lisa Brandt, female    DOB: May 11, 2000, 19 y.o.   MRN: [id number]  Sore Throat   she is doing great. No significant pain and eating solids.     Review of Systems     Objective:   Physical Exam  She is happy and smiling. Ready to go home. There is still erythema of the left tonsil area but less swelling. No trismus      Assessment & Plan:  Tonsillitis/PTA- she is great and eating. She can be discharged on Augmentin and follow up if she is not resolved in few days. Call the outpatient follow up under [facility] Boening[facility] on Am[facility] for follow up ENT

## 2020-04-01 ENCOUNTER — Telehealth: Payer: Self-pay | Admitting: Internal Medicine

## 2020-04-01 ENCOUNTER — Telehealth: Payer: Self-pay

## 2020-04-01 MED ORDER — CLINDAMYCIN HCL 300 MG PO CAPS: 300.0000 mg | ORAL_CAPSULE | Freq: Three times a day (TID) | ORAL | 0 refills | Status: AC

## 2020-04-01 NOTE — Telephone Encounter (Signed)
Good question. I will route this to Dr. Sande Brothers. I spoke with him by phone, he will call the patient and see what is going on. Patient tolerated clindamycin well in the hospital, so may be appropriate to switch if she is having a true allergy to the amox component.

## 2020-04-01 NOTE — Telephone Encounter (Signed)
Just spoke with the patient over the phone (~2:30 pm). She reports she developed a "funny feeling" in her throat within 30 minutes of taking her first dose of Augmentin yesterday evening. The feeling eventually dissipated, and she went to sleep. This afternoon around 1 pm, she took an additional dose and developed a similar feeling in her throat. States the feeling is distinct from her throat pain from her tonsillitis and is on the L side of her throat (the side of the abscess). She denies skin changes, abdominal pain, N/V/D, difficulty breathing.   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 Prior to discharge, spoke with patient about this allergy, and she reported that she could not remember much about her reaction as she was a child, however does recall having a skin rash. At that time counseled her to monitor for symptoms after taking the Augmentin and to take a Benadryl if she developed hives.  Assessment/Plan: Based on the patient's symptoms of throat irritation, she may be having a mild allergic reaction to the amoxicillin component of Augmentin. Recommended patient take 25 mg of Benadryl. Will send a prescription of PO clindamycin to her pharmacy. Counseled patient to present to the ED if she develops worsening symptoms and/or shortness of breath.

## 2020-04-01 NOTE — Telephone Encounter (Signed)
TOC HFU APPT [facility] FOR 04/14/2020 @ 1:45 PM WITH DR AMPONSAH PER DR WINTERS.

## 2020-04-01 NOTE — Telephone Encounter (Signed)
Requesting to speak with a nurse about amoxicillin-clavulanate (AUGMENTIN) 875-125 MG tablet. Please call pt back.

## 2020-04-01 NOTE — Telephone Encounter (Signed)
Return pt's call - stated she was discharged from the hospital on Amoxicillin which she started taking. Stated after taking, her throat started feeling weird; she does have an allergy to PCN. Please advise.  Thanks

## 2020-04-01 NOTE — Telephone Encounter (Signed)
Thanks for following up on that. I agree with your plan.

## 2020-04-06 NOTE — Discharge Summary (Signed)
Name: Lisa Brandt MRN: [id number] DOB: 2000-07-06 20 y.o. PCP: Patient, No Pcp Per  Date of Admission: 03/30/2020 11:12 AM Date of Discharge: 03/31/2020 Attending Physician: Dr. Carlynn Purl  Discharge Diagnosis:  1. Peritonsillar abscess  Discharge Medications: Allergies as of 03/31/2020      Reactions   Penicillins Hives      Medication List    TAKE these medications   acetaminophen 325 MG tablet Commonly known as: TYLENOL Take 650 mg by mouth every 6 (six) hours as needed for mild pain or headache.   ELDERBERRY PO Take 1 each by mouth in the morning, at noon, and at bedtime.   ondansetron 4 MG tablet Commonly known as: ZOFRAN Take 1 tablet (4 mg total) by mouth every 6 (six) hours as needed for nausea.   THERAFLU COLD & COUGH PO Take 30 mLs by mouth 3 (three) times daily as needed (flu symptoms).       Disposition and follow-up:   Lisa Brandt was discharged from [facility] in Good condition.  At the hospital follow up visit please address:  1.    Left peritonsillar abscess/acute tonsillitis -  Evaluate for resolution of symptoms and completion of 14-day course of PO clindamycin - She was instructed to follow-up with ENT if her swelling did not resolve  Penicillin allergy - She has a documented allergy to penicillins of hives - Initially prescribed Augmentin at discharge, however she reported a "funny feeling" in her throat after taking the first couple of doses. Denies skin changes, abdominal distress, or trouble breathing - Out of an abundance of caution, switched to a course of clindamycin instead - Consider arranging for penicillin challenge with Allergy & Immunology  Establishment of care - Patient reported she does not have a PCP and is interested in establishing care   2.  Labs / imaging needed at time of follow-up: None  3.  Pending labs/ test needing follow-up: None  Follow-up Appointments:  Follow-up Information     Dolan Amen, MD. Call.   Specialty: Internal Medicine Why: Please call to confirm your appointment.  Additionally, please reach out to your ENT specialist at [phone] to make an appointment.  Contact information: [address] [place] [address] [phone]               Hospital Course by problem list:  Deepika Decatur is a 20 y.o. woman with no pertinent past medical history presenting with sore throat x3 days and odynophagia with CT findings suggestive of 2 left-sided peritonsillar abscesses.  Left peritonsillar abscess/acute tonsilittis Presented to the ED for evaluation of sore throat. She initially tried to present the day prior where a strep test was obtained and positive however due to long wait she left without being seen. She returned for worsening pain. She was found to have a peritonsillar abscess. She was evaluated by ENT who recommended medical therapy (IV clindamycin) and overnight observation. The next morning she was symptomatically improved, felt well, and was able to tolerate a diet. No trismus or shortness of breath. She was initially transitioned to PO Augmentin. 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 Spoke with patient who reported she cannot recall her reaction. Discussed possibility of developing hives on Augmentin and recommendation to use Benadryl should that occur. Also instructed to seek immediate medical care if she developed respiratory distress. She was was switched to PO clindamycin the day after discharge after calling [facility] and  reporting a "funny feeling" in her throat. Consider referral to Allergy & Immunology for penicillin challenge.  Discharge Vitals:   BP (!) 111/58 (BP Location: Right Arm)   Pulse (!) 56   Temp 98.2 F (36.8 C) (Oral)   Resp 16   Ht 5\' 7"  (1.702 m)   Wt 104.3 kg   LMP 03/09/2020 (Exact Date)   SpO2 100%   BMI 36.02 kg/m   Pertinent Labs, Studies, and Procedures:    03/30/20 CT Soft Tissue Neck W Contrast  CLINICAL DATA:  Tonsil/adenoid disorder. Question peritonsillar abscess. EXAM: CT NECK WITH CONTRAST TECHNIQUE: Multidetector CT imaging of the neck was performed using the standard protocol following the bolus administration of intravenous contrast. CONTRAST:  4mL OMNIPAQUE IOHEXOL 300 MG/ML  SOLN COMPARISON:  None. FINDINGS: Pharynx and larynx: Bilateral tonsillar enlargement and striated/heterogeneous enhancement, compatible with tonsillitis. There is a 1.0 by 1.7 by 1.3 cm left peritonsillar fluid collection, compatible with abscess (see series 3, image 48 and series 7, image 59). There is an adjacent smaller 6 mm fluid collection more anteriorly and superiorly, compatible with an additional abscess (see series 3, image 43). Stranding/edema extends inferiorly to the base of the epiglottis, involving the left area epiglottic fold. Edema extends laterally to involve the left parapharyngeal space and left submandibular space. Change in there is associated mass effect on the airway, which remains patent. Prominent adenoid soft tissue, most likely adenoid hypertrophy. Salivary glands: Inflammatory changes from the above process extending to involve the left submandibular space and surround the left submandibular gland. The submandibular gland itself appears unremarkable and symmetric. The parotid glands are normal. Thyroid: Normal. Lymph nodes: Bilateral upper cervical lymphadenopathy without specific evidence of suppuration. Vascular: Grossly patent. Limited intracranial: Negative. Visualized orbits: Negative. Mastoids and visualized paranasal sinuses: Mucosal thickening of the inferior right maxillary sinus. Remaining sinuses are largely clear. No mastoid effusions. Skeleton: No acute or aggressive process. Upper chest: Negative. IMPRESSION: 1. Findings consistent with tonsillitis with two left peritonsillar abscesses, as detailed above. Edema extends inferiorly to  involve the base of the left epiglottis/aryepiglottic fold and inferolaterally to involve the parapharyngeal and submandibular spaces. Associated mass effect on the pharyngeal airway, which remains patent. 2. Cervical lymphadenopathy without evidence of suppuration. 3. Prominent adenoid soft tissue. This likely represents adenoid hypertrophy; however, recommend correlation with direct inspection. Electronically Signed   By: 75m MD   On: 03/30/2020 17:31    Discharge Instructions: Discharge Instructions    Call MD for:  difficulty breathing, headache or visual disturbances   Complete by: As directed    Call MD for:  extreme fatigue   Complete by: As directed    Call MD for:  persistant dizziness or light-headedness   Complete by: As directed    Call MD for:  persistant nausea and vomiting   Complete by: As directed    Call MD for:  redness, tenderness, or signs of infection (pain, swelling, redness, odor or green/yellow discharge around incision site)   Complete by: As directed    Call MD for:  temperature >100.4   Complete by: As directed    Diet - low sodium heart healthy   Complete by: As directed    Increase activity slowly   Complete by: As directed      To Ms. Rudder,   It was a pleasure taking care of you during your stay. We have prescribed an antibiotic (Augmentin) Please take one pill twice daily for 14 days. We will schedule  a follow up appointment in our clinic to see how you are doing. We will also provide you with your ENT's office, please schedule a follow up with them. Additionally, if you begin to have difficulty breathing, please go to the nearest emergency room for evaluation.   Signed: Alphonzo Severance, MD 04/06/2020, 2:58 PM   Pager: [phone]

## 2020-04-14 ENCOUNTER — Other Ambulatory Visit: Payer: Self-pay

## 2020-04-14 ENCOUNTER — Encounter: Payer: Self-pay | Admitting: Student

## 2020-04-14 ENCOUNTER — Ambulatory Visit (INDEPENDENT_AMBULATORY_CARE_PROVIDER_SITE_OTHER): Payer: Medicaid Other | Admitting: Student

## 2020-04-14 VITALS — BP 122/81 | HR 73 | Temp 98.0°F | Ht 68.0 in | Wt 236.0 lb

## 2020-04-14 DIAGNOSIS — J36 Peritonsillar abscess: Secondary | ICD-10-CM | POA: Diagnosis not present

## 2020-04-14 DIAGNOSIS — Z880 Allergy status to penicillin: Secondary | ICD-10-CM

## 2020-04-14 DIAGNOSIS — Z118 Encounter for screening for other infectious and parasitic diseases: Secondary | ICD-10-CM

## 2020-04-14 NOTE — Assessment & Plan Note (Signed)
Patient here for hospital follow-up. Patient was seen in the ED on 12/16 for sore throat and found to have 2 left peritonsillar abscesses. Patient was treated with IV clindamycin and admitted overnight for observation.  Patient discharged home with 14-days course of p.o. clindamycin due due to history of penicillin allergy. Patient states since discharge, her symptoms have completely resolved. She denies any sore throat, fever, chills, nausea or mouth pain. States she will finish her clindamycin course tomorrow. --Finish 14-day course of clindamycin --Referred to allergy and immunology for penicillin sensitivity testing

## 2020-04-14 NOTE — Assessment & Plan Note (Addendum)
Patient is 20 years old and she is recommended to get a yearly GC/chlamydia screening. Patient states she is currently not sexually active. She plans to get tested at next office visit. --GC/Chlamydia screening at next follow-up --Consider Pap smear

## 2020-04-14 NOTE — Patient Instructions (Addendum)
Thank you, Lisa Brandt for allowing [place] to provide your care today. Today we discussed your abscess.  We are glad that this is getting better.  Please make sure to finish the rest of the antibiotics.  Look forward to being your primary care providers.   I have place a referrals to Allergy and Immunology  Please follow-up in 6 months  Should you have any questions or concerns please call the internal medicine clinic at [phone].    Sharrell Ku, MD, MPH [facility]   My Chart Access: [email or web address]   If you have not already done so, please get your COVID 19 vaccine  To schedule an appointment for a COVID vaccine choice any of the following: Go to [email or web address]   Go to [email or web address]                  Call [phone]                                     Call [phone] and select Option 2

## 2020-04-14 NOTE — Progress Notes (Signed)
   CC: Establish care  HPI:  Ms.Alice Berntsen is a 20 y.o. with no significant past medical history presents to clinic for hospital follow-up and to establish care. Please see problem based charting for evaluation, assessment and plan.  Past Medical History:  Diagnosis Date  . Peritonsillar abscess 02/28/2020   Patient hospitalized for 1 day and received IV clindamycin  . Strep pharyngitis    Medications: PRN Tylenol, no OTC supplements  Allergies: Penicillin  Family History: History of breast cancer in grandmother.  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 Family moved from [place] 10 years ago and currently lives in [place]. She has 5 siblings. She denies EtOH, cigarette or illicit drug use. She cooks most of her meals and does Zumba for exercise.  Sexual History: States she is not currently sexually active. She is in a monogamous relationship with another woman. She does not want OCPs right now but will ask for one when the time comes. Denies history of STIs.  Review of Systems:  Constitutional: Negative for fatigue, fever or chills HEENT: Negative for sore throat, mouth pain or jaw pain Respiratory: Negative for shortness of breath Cardiac: Negative for chest pain MSK: Negative for back pain or trauma Abdomen: Negative for abdominal pain, N/V, constipation or diarrhea Neuro: Negative for headache or weakness  Physical Exam: General: Pleasant obese young woman. No acute distress. HEENT: MMM. PERRLA. No tonsillar exudate or erythema. No ttp of the jaw.  No lymphadenopathy. Cardiac: RRR. No murmurs, rubs or gallops. No LE edema Respiratory: Lungs CTAB. No wheezing or crackles. Abdominal: Soft, symmetric and non tender. Normal BS. Skin: Warm, dry and intact without rashes or lesions Extremities: Atraumatic. Full ROM. Pulse palpable. Neuro: A&O x 3. Moves all  extremities.  Normal sensation. Psych: Appropriate mood and affect.  Vitals:   04/14/20 1404  BP: 122/81  Pulse: 73  Temp: 98 F (36.7 C)  TempSrc: Oral  SpO2: 100%  Weight: 236 lb (107 kg)  Height: 5\' 8"  (1.727 m)    Assessment & Plan:   See Encounters Tab for problem based charting.  Patient discussed with Dr. , MD, MPHlinic

## 2020-04-15 NOTE — Progress Notes (Signed)
Internal Medicine Clinic Attending  Case discussed with Dr. Amponsah  At the time of the visit.  We reviewed the resident's history and exam and pertinent patient test results.  I agree with the assessment, diagnosis, and plan of care documented in the resident's note.  

## 2020-06-05 NOTE — Progress Notes (Signed)
New Patient Note  RE: Lisa Brandt MRN: [id number] DOB: 01/11/2001 Date of Office Visit: 06/06/2020  Referring provider: Tyson AliasVincent, Duncan Thomas,* Primary care provider: Steffanie RainwaterAmponsah, Prosper M, MD  Chief Complaint: Allergic Reaction (Penicillin allergy when she was younger caused hives- jan. Abscess on tonsils and was given something in the penicillin family and it made her throat worse. She could feel something in her throat.) and Food/Drug Challenge (Penicillin/amoxicillin)  History of Present Illness: I had the pleasure of seeing Lisa Brandt for initial evaluation at [facility] on 06[facility]06[facility]2020. She is a 20 y.o. female, who is referred here by Dr. Oswaldo DoneVincent for the evaluation of penicillin allergy.  In January 2022 patient had a peritonsillar abscess and she was given Augmentin.  She took it for about 3 times and noted some throat discomfort. Denies any rash/hives, itching or trouble breathing. Symptoms resolved after 1 hour with no intervention. No prior history of dysphagia or trouble swallowing pills.   She took clindamycin with no issues afterwards to clear up the infection.  Patient broke out in hives as a child after taking penicillin. She is not how long the hives lasted and she was not hospitalized for this.  No other history of other drug reactions.   Assessment and Plan: Lisa Brandt is a 20 y.o. female with: Drug reaction History of hives after taking penicillin as a child and most recently noticed some discomfort in her throat after taking Augmentin for 3 times for peritonsillar abscess.  Negative skin prick and intradermal testing today to penicillin G and pre pen.  Patient tolerated 550mg  of amoxicillin today with no reactions.   I removed penicillin from allergy list.  You may take penicillin type of antibiotics in the future.  Your risk of developing penicillin allergy in the future is the same as the general population's.  Discussed with  patient that throat discomfort is not a typical IgE mediated adverse drug reaction - concerning if the Augmentin pill got stuck in the esophagus causing those symptoms. No history of dysphagia.  Other allergic rhinitis Rhinoconjunctivitis symptoms in the spring and takes over-the-counter allergy medications with good benefit.  No prior allergy evaluation.  Recommend returning for environmental allergy testing.  May use over the counter antihistamines such as Zyrtec (cetirizine), Claritin (loratadine), Allegra (fexofenadine), or Xyzal (levocetirizine) daily as needed. May take twice a day during flares.  May use Flonase (fluticasone) nasal spray 1 spray per nostril twice a day as needed for nasal congestion.   May use over the counter eye drops as needed.  Return for Skin testing.  Meds ordered this encounter  Medications  . fluticasone (FLONASE) 50 MCG/ACT nasal spray    Sig: Place 1 spray into both nostrils 2 (two) times daily as needed for allergies or rhinitis.    Dispense:  16 g    Refill:  5  . cetirizine (ZYRTEC ALLERGY) 10 MG tablet    Sig: Take 1 tablet (10 mg total) by mouth daily.    Dispense:  30 tablet    Refill:  5   Lab Orders  No laboratory test(s) ordered today    Other allergy screening: Asthma: no Rhino conjunctivitis:  Nasal congestion, itchy eyes, sneezing which gets worse in the spring and takes OTC meds with good benefit.  Food allergy: no Hymenoptera allergy: no Urticaria: no Eczema:yes History of recurrent infections suggestive of immunodeficency: no  Diagnostics: Skin Testing:  Negative test to: prepen and penicillin G.  Drug challenge total  time for oral part - 75 minutes.  Results discussed with patient/family.  Oral Challenge - 06/06/20 1100    Challenge Food/Drug Penicillin/Amoxicillin 250mg /50 mL    Lot #  if Applicable A    Food/Drug provided by Practice    Comments Exp: 08/2021    BP 124/78    Pulse 102    Respirations 16     Lungs 97%    Skin Clear    Mouth Clear    Time 1108    Dose 1 mL    Time 1126    Dose 10 mL          Penicillin - 06/06/20 1044    Manufacturer N/A    Lot # N/A    Location Arm    Number of allergen test 8    Time Testing Placed 1002   Right Arm   Control SPT Negative    Histamine SPT 2+    Pre-Pen Puncture Negative    Penicillin-G 5000 u/ml SPT Negative    Select Select    Time Testing Placed 1022   Left Arm   Control Intradermal Negative    Pre-Pen Intradermal Negative    Penicillin-G 50 u/ml Intradermal Negative    Time Testing Placed 1043   Left Arm   Penicillin-G 5000 u/ml Intradermal Negative           Past Medical History: Patient Active Problem List   Diagnosis Date Noted  . Drug reaction 06/06/2020  . Other allergic rhinitis 06/06/2020  . Screening for chlamydial disease 04/14/2020  . Peritonsillar abscess 03/30/2020   Past Medical History:  Diagnosis Date  . Eczema   . Peritonsillar abscess 02/28/2020   Patient hospitalized for 1 day and received IV clindamycin  . Strep pharyngitis   . Urticaria    Past Surgical History: History reviewed. No pertinent surgical history. Medication List:  Current Outpatient Medications  Medication Sig Dispense Refill  . acetaminophen (TYLENOL) 325 MG tablet Take 650 mg by mouth every 6 (six) hours as needed for mild pain or headache.    . cetirizine (ZYRTEC ALLERGY) 10 MG tablet Take 1 tablet (10 mg total) by mouth daily. 30 tablet 5  . ELDERBERRY PO Take 1 each by mouth in the morning, at noon, and at bedtime.    . fluticasone (FLONASE) 50 MCG/ACT nasal spray Place 1 spray into both nostrils 2 (two) times daily as needed for allergies or rhinitis. 16 g 5  . Phenylephrine-Pheniramine-DM (THERAFLU COLD & COUGH PO) Take 30 mLs by mouth 3 (three) times daily as needed (flu symptoms).    . ondansetron (ZOFRAN) 4 MG tablet Take 1 tablet (4 mg total) by mouth every 6 (six) hours as needed for nausea. (Patient not taking:  Reported on 06/06/2020) 20 tablet 0   No current facility-administered medications for this visit.   Allergies: No Known Allergies Social History: Social History   Socioeconomic History  . Marital status: Single    Spouse name: Not on file  . Number of children: Not on file  . Years of education: Not on file  . Highest education level: Not on file  Occupational History  . Not on file  Tobacco Use  . Smoking status: Never Smoker  . Smokeless tobacco: Never Used  Vaping Use  . Vaping Use: Never used  Substance and Sexual Activity  . Alcohol use: Not Currently  . Drug use: Never  . Sexual activity: Yes    Birth control/protection: Condom  Other Topics  Concern  . Not on file  Social History Narrative  . Not on file   Social Determinants of Health   Financial Resource Strain: Not on file  Food Insecurity: Not on file  Transportation Needs: Not on file  Physical Activity: Not on file  Stress: Not on file  Social Connections: Not on file   Lives in an apartment. Smoking: denies  Occupation: Therapist, occupational - Arts development officer HistorySurveyor, minerals in the house: no Carpet in the family room: yes Carpet in the bedroom: yes Heating: electric Cooling: central Pet: no  Family History: Family History  Problem Relation Age of Onset  . Healthy Mother   . Healthy Father    Problem                               Relation Asthma                                   Sister  Eczema                                No  Food allergy                          No  Allergic rhino conjunctivitis     No  Drug allergy   Sister  Review of Systems  Constitutional: Negative for appetite change, chills, fever and unexpected weight change.  HENT: Positive for congestion and rhinorrhea.   Eyes: Positive for itching.  Respiratory: Negative for cough, chest tightness, shortness of breath and wheezing.   Cardiovascular: Negative for chest pain.  Gastrointestinal: Negative for  abdominal pain.  Genitourinary: Negative for difficulty urinating.  Skin: Negative for rash.  Neurological: Negative for headaches.   Objective: BP 126/78   Pulse 70   Temp 97.7 F (36.5 C)   Resp 18   Ht 5\' 7"  (1.702 m)   Wt 226 lb 6.4 oz (102.7 kg)   SpO2 98%   BMI 35.46 kg/m  Body mass index is 35.46 kg/m. Physical Exam Vitals and nursing note reviewed.  Constitutional:      Appearance: Normal appearance. She is well-developed.  HENT:     Head: Normocephalic and atraumatic.     Right Ear: External ear normal.     Left Ear: External ear normal.     Nose: Nose normal.     Mouth/Throat:     Mouth: Mucous membranes are moist.     Pharynx: Oropharynx is clear.  Eyes:     Conjunctiva/sclera: Conjunctivae normal.  Cardiovascular:     Rate and Rhythm: Normal rate and regular rhythm.     Heart sounds: Normal heart sounds. No murmur heard. No friction rub. No gallop.   Pulmonary:     Effort: Pulmonary effort is normal.     Breath sounds: Normal breath sounds. No wheezing, rhonchi or rales.  Abdominal:     Palpations: Abdomen is soft.  Musculoskeletal:     Cervical back: Neck supple.  Skin:    General: Skin is warm.     Findings: No rash.  Neurological:     Mental Status: She is alert and oriented to person, place, and time.  Psychiatric:        Behavior: Behavior normal.  The plan was reviewed with the patient/family, and all questions/concerned were addressed.  It was my pleasure to see Lisa Brandt today and participate in her care. Please feel free to contact me with any questions or concerns.  Sincerely,  Wyline Mood, DO Allergy & Immunology  [facility] office: [phone] [facility] office: [phone]

## 2020-06-06 ENCOUNTER — Ambulatory Visit (INDEPENDENT_AMBULATORY_CARE_PROVIDER_SITE_OTHER): Payer: Medicaid Other | Admitting: Allergy

## 2020-06-06 ENCOUNTER — Encounter: Payer: Self-pay | Admitting: Allergy

## 2020-06-06 ENCOUNTER — Other Ambulatory Visit: Payer: Self-pay

## 2020-06-06 VITALS — BP 126/78 | HR 70 | Temp 97.7°F | Resp 18 | Ht 67.0 in | Wt 226.4 lb

## 2020-06-06 DIAGNOSIS — Z888 Allergy status to other drugs, medicaments and biological substances status: Secondary | ICD-10-CM | POA: Diagnosis not present

## 2020-06-06 DIAGNOSIS — J3089 Other allergic rhinitis: Secondary | ICD-10-CM | POA: Insufficient documentation

## 2020-06-06 DIAGNOSIS — L500 Allergic urticaria: Secondary | ICD-10-CM | POA: Diagnosis not present

## 2020-06-06 DIAGNOSIS — T360X5A Adverse effect of penicillins, initial encounter: Secondary | ICD-10-CM

## 2020-06-06 DIAGNOSIS — T50905A Adverse effect of unspecified drugs, medicaments and biological substances, initial encounter: Secondary | ICD-10-CM | POA: Diagnosis not present

## 2020-06-06 DIAGNOSIS — T50995A Adverse effect of other drugs, medicaments and biological substances, initial encounter: Secondary | ICD-10-CM

## 2020-06-06 DIAGNOSIS — T50905D Adverse effect of unspecified drugs, medicaments and biological substances, subsequent encounter: Secondary | ICD-10-CM

## 2020-06-06 DIAGNOSIS — Z880 Allergy status to penicillin: Secondary | ICD-10-CM

## 2020-06-06 MED ORDER — CETIRIZINE HCL 10 MG PO TABS: 10.0000 mg | ORAL_TABLET | Freq: Every day | ORAL | 5 refills | Status: DC

## 2020-06-06 MED ORDER — FLUTICASONE PROPIONATE 50 MCG/ACT NA SUSP: 1.0000 | Freq: Two times a day (BID) | NASAL | 5 refills | Status: DC | PRN

## 2020-06-06 NOTE — Patient Instructions (Addendum)
Penicillin allergy  For next 24 hours monitor for hives, swelling, shortness of breath and dizziness. If you see these symptoms, use Benadryl for mild symptoms and epinephrine for more severe symptoms and call 911.   I will take off penicillin drug from allergy list.  You may take penicillin type of antibiotics in the future.   Your risk of developing penicillin allergy in the future is the same as the general population's.  Other allergic rhinitis  Recommend returning for environmental allergy testing.  May use over the counter antihistamines such as Zyrtec (cetirizine), Claritin (loratadine), Allegra (fexofenadine), or Xyzal (levocetirizine) daily as needed. May take twice a day during flares.  May use Flonase (fluticasone) nasal spray 1 spray per nostril twice a day as needed for nasal congestion.   May use over the counter eye drops as needed.  Follow up for allergy testing for environmental allergies in the future.

## 2020-06-06 NOTE — Assessment & Plan Note (Signed)
History of hives after taking penicillin as a child and most recently noticed some discomfort in her throat after taking Augmentin for 3 times for peritonsillar abscess.  Negative skin prick and intradermal testing today to penicillin G and pre pen.  Patient tolerated 550mg  of amoxicillin today with no reactions.   I removed penicillin from allergy list.  You may take penicillin type of antibiotics in the future.  Your risk of developing penicillin allergy in the future is the same as the general population's.  Discussed with patient that throat discomfort is not a typical IgE mediated adverse drug reaction - concerning if the Augmentin pill got stuck in the esophagus causing those symptoms. No history of dysphagia.

## 2020-06-06 NOTE — Assessment & Plan Note (Signed)
Rhinoconjunctivitis symptoms in the spring and takes over-the-counter allergy medications with good benefit.  No prior allergy evaluation.  Recommend returning for environmental allergy testing.  May use over the counter antihistamines such as Zyrtec (cetirizine), Claritin (loratadine), Allegra (fexofenadine), or Xyzal (levocetirizine) daily as needed. May take twice a day during flares.  May use Flonase (fluticasone) nasal spray 1 spray per nostril twice a day as needed for nasal congestion.   May use over the counter eye drops as needed.

## 2020-09-22 ENCOUNTER — Ambulatory Visit (INDEPENDENT_AMBULATORY_CARE_PROVIDER_SITE_OTHER): Payer: Medicaid Other | Admitting: Allergy

## 2020-09-22 ENCOUNTER — Encounter: Payer: Self-pay | Admitting: Allergy

## 2020-09-22 ENCOUNTER — Other Ambulatory Visit: Payer: Self-pay

## 2020-09-22 VITALS — BP 128/76 | HR 91 | Temp 98.0°F | Resp 16 | Ht 67.5 in | Wt 233.2 lb

## 2020-09-22 DIAGNOSIS — J3089 Other allergic rhinitis: Secondary | ICD-10-CM | POA: Diagnosis not present

## 2020-09-22 MED ORDER — CETIRIZINE HCL 10 MG PO TABS
10.0000 mg | ORAL_TABLET | Freq: Every day | ORAL | 5 refills | Status: DC
Start: 2020-09-22 — End: 2021-11-11

## 2020-09-22 MED ORDER — FLUTICASONE PROPIONATE 50 MCG/ACT NA SUSP: 1.0000 | Freq: Two times a day (BID) | NASAL | 5 refills | Status: DC | PRN

## 2020-09-22 NOTE — Progress Notes (Signed)
Follow Up Note  RE: Lisa Brandt MRN: [id number] DOB: April 01, 2000 Date of Office Visit: 09/22/2020  Referring provider: Steffanie Rainwater, MD Primary care provider: Steffanie Rainwater, MD  Chief Complaint: Allergy Testing (Not sure what she needs to be tested for.)  History of Present Illness: I had the pleasure of seeing [name] for a [facility]  on 09/22/2020. She is a 20 y.o. female, who is being followed for adverse drug reaction and allergic rhinitis. Her previous allergy office visit was on 06/06/2020 with Dr. Selena Batten. Today is a  skin testing and follow-up visit .  Drug reaction No issues after drug challenge and has not had to take any penicillin type of antibiotics.   Allergic rhinitis Patient had some flares in the spring but now improving. Currently not taking any daily antihistamines - usually uses zyrtec and Flonase prn with good benefit.   Assessment and Plan: [name] is a 20 y.o. female with: Other allergic rhinitis Past history - Rhinoconjunctivitis symptoms in the spring and takes over-the-counter allergy medications with good benefit. Interim history - uses zyrtec and Flonase prn with good benefit.  Today's skin testing showed: Positive to grass, weed, ragweed, trees, mold, cockroach. Start environmental control measures as below. Use over the counter antihistamines such as Zyrtec (cetirizine), Claritin (loratadine), Allegra (fexofenadine), or Xyzal (levocetirizine) daily as needed. May take twice a day during allergy flares. May switch antihistamines every few months. Use Flonase (fluticasone) nasal spray 1 spray per nostril twice a day as needed for nasal congestion.  Nasal saline spray (i.e., Simply Saline) or nasal saline lavage (i.e., NeilMed) is recommended as needed and prior to medicated nasal sprays. Consider allergy injections for long term control if above medications do not help the symptoms - handout  given.   Return in about 1 year (around 09/22/2021).  Meds ordered this encounter  Medications   cetirizine (ZYRTEC ALLERGY) 10 MG tablet    Sig: Take 1 tablet (10 mg total) by mouth daily.    Dispense:  30 tablet    Refill:  5   fluticasone (FLONASE) 50 MCG/ACT nasal spray    Sig: Place 1 spray into both nostrils 2 (two) times daily as needed for allergies or rhinitis.    Dispense:  16 g    Refill:  5    Lab Orders  No laboratory test(s) ordered today    Diagnostics: Skin Testing: Environmental allergy panel. Positive to grass, weed, ragweed, trees, mold, cockroach. Results discussed with patient/family.  Airborne Adult Perc - 09/22/20 0920     Time Antigen Placed 0920    Allergen Manufacturer Waynette Buttery    Location Back    Number of Test 59    1. Control-Buffer 50% Glycerol Negative    2. Control-Histamine 1 mg/ml 4+    3. Albumin saline Negative    4. Bahia 4+    5. [place] 4+    6. Johnson 4+    7. Kentucky Blue 3+    8. Meadow Fescue 3+    9. Perennial Rye 2+    10. Sweet Vernal 4+    11. Timothy Negative    12. Cocklebur 2+    13. Burweed Marshelder 3+    14. Ragweed, short 4+    15. Ragweed, Giant 2+    16. Plantain,  English 3+    17. Lamb's Quarters 2+    18. Sheep Sorrell 3+    19. Rough Pigweed 4+  20. Marsh Elder, Rough 2+    21. Mugwort, Common 4+    22. Ash mix 2+    23. Birch mix 4+    24. Beech American 4+    25. Box, Elder 2+    26. Cedar, red 2+    27. Cottonwood, Eastern 3+    28. Elm mix 3+    29. Hickory 4+    30. Maple mix 4+    31. Oak, Guinea[place] mix 4+    32. Pecan Pollen 4+    33. Pine mix Negative    34. Sycamore Eastern 2+    35. Walnut, Black Pollen 3+    36. Alternaria alternata Negative    37. Cladosporium Herbarum Negative    38. Aspergillus mix Negative    39. Penicillium mix Negative    40. Bipolaris sorokiniana (Helminthosporium) Negative    41. Drechslera spicifera (Curvularia) Negative    42. Mucor plumbeus Negative     43. Fusarium moniliforme Negative    44. Aureobasidium pullulans (pullulara) Negative    45. Rhizopus oryzae Negative    46. Botrytis cinera Negative    47. Epicoccum nigrum Negative    48. Phoma betae Negative    49. Candida Albicans Negative    50. Trichophyton mentagrophytes Negative    51. Mite, D Farinae  5,000 AU/ml Negative    52. Mite, D Pteronyssinus  5,000 AU/ml Negative    53. Cat Hair 10,000 BAU/ml Negative    54.  Dog Epithelia Negative    55. Mixed Feathers Negative    56. Horse Epithelia Negative    57. Cockroach, German Negative    58. Mouse Negative    59. Tobacco Leaf Negative             Intradermal - 09/22/20 0950     Time Antigen Placed 0950    Allergen Manufacturer Waynette Buttery    Location Arm    Number of Test 9    Intradermal Select    Control Negative    Mold 1 Negative    Mold 2 Negative    Mold 3 Negative    Mold 4 2+    Cat Negative    Dog Negative    Cockroach 2+    Mite mix Negative             Medication List:  Current Outpatient Medications  Medication Sig Dispense Refill   acetaminophen (TYLENOL) 325 MG tablet Take 650 mg by mouth every 6 (six) hours as needed for mild pain or headache.     ELDERBERRY PO Take 1 each by mouth in the morning, at noon, and at bedtime.     Phenylephrine-Pheniramine-DM (THERAFLU COLD & COUGH PO) Take 30 mLs by mouth 3 (three) times daily as needed (flu symptoms).     cetirizine (ZYRTEC ALLERGY) 10 MG tablet Take 1 tablet (10 mg total) by mouth daily. 30 tablet 5   fluticasone (FLONASE) 50 MCG/ACT nasal spray Place 1 spray into both nostrils 2 (two) times daily as needed for allergies or rhinitis. 16 g 5   ondansetron (ZOFRAN) 4 MG tablet Take 1 tablet (4 mg total) by mouth every 6 (six) hours as needed for nausea. (Patient not taking: No sig reported) 20 tablet 0   No current facility-administered medications for this visit.   Allergies: No Known Allergies I reviewed her past medical history, social  history, family history, and environmental history and no significant changes have been reported from her previous visit.  Review of Systems  Constitutional:  Negative for appetite change, chills, fever and unexpected weight change.  HENT:  Negative for congestion and rhinorrhea.   Eyes:  Negative for itching.  Respiratory:  Negative for cough, chest tightness, shortness of breath and wheezing.   Cardiovascular:  Negative for chest pain.  Gastrointestinal:  Negative for abdominal pain.  Genitourinary:  Negative for difficulty urinating.  Skin:  Negative for rash.  Allergic/Immunologic: Positive for environmental allergies.  Neurological:  Negative for headaches.   Objective: BP 128/76   Pulse 91   Temp 98 F (36.7 C) (Temporal)   Resp 16   Ht 5' 7.5" (1.715 m)   Wt 233 lb 3.2 oz (105.8 kg)   SpO2 98%   BMI 35.99 kg/m  Body mass index is 35.99 kg/m. Physical Exam Vitals and nursing note reviewed.  Constitutional:      Appearance: Normal appearance. She is well-developed.  HENT:     Head: Normocephalic and atraumatic.     Right Ear: Tympanic membrane and external ear normal.     Left Ear: Tympanic membrane and external ear normal.     Nose: Congestion (on left side) present.     Comments: Transverse nasal crease    Mouth/Throat:     Mouth: Mucous membranes are moist.     Pharynx: Oropharynx is clear.  Eyes:     Conjunctiva/sclera: Conjunctivae normal.  Cardiovascular:     Rate and Rhythm: Normal rate and regular rhythm.     Heart sounds: Normal heart sounds. No murmur heard.   No friction rub. No gallop.  Pulmonary:     Effort: Pulmonary effort is normal.     Breath sounds: Normal breath sounds. No wheezing, rhonchi or rales.  Musculoskeletal:     Cervical back: Neck supple.  Skin:    General: Skin is warm.     Findings: No rash.  Neurological:     Mental Status: She is alert and oriented to person, place, and time.  Psychiatric:        Behavior: Behavior  normal.   Previous notes and tests were reviewed. The plan was reviewed with the patient/family, and all questions/concerned were addressed.  It was my pleasure to see Aayliah today and participate in her care. Please feel free to contact me with any questions or concerns.  Sincerely,  Wyline Mood, DO Allergy & Immunology  [facility] office: [phone] [facility] office: [phone]

## 2020-09-22 NOTE — Assessment & Plan Note (Signed)
Past history - Rhinoconjunctivitis symptoms in the spring and takes over-the-counter allergy medications with good benefit. Interim history - uses zyrtec and Flonase prn with good benefit.   Today's skin testing showed: Positive to grass, weed, ragweed, trees, mold, cockroach.  Start environmental control measures as below.  Use over the counter antihistamines such as Zyrtec (cetirizine), Claritin (loratadine), Allegra (fexofenadine), or Xyzal (levocetirizine) daily as needed. May take twice a day during allergy flares. May switch antihistamines every few months.  Use Flonase (fluticasone) nasal spray 1 spray per nostril twice a day as needed for nasal congestion.   Nasal saline spray (i.e., Simply Saline) or nasal saline lavage (i.e., NeilMed) is recommended as needed and prior to medicated nasal sprays.  Consider allergy injections for long term control if above medications do not help the symptoms - handout given.

## 2020-09-22 NOTE — Patient Instructions (Addendum)
Today's skin testing showed: Positive to grass, weed, ragweed, trees, mold, cockroach.  Environmental allergies Start environmental control measures as below. Use over the counter antihistamines such as Zyrtec (cetirizine), Claritin (loratadine), Allegra (fexofenadine), or Xyzal (levocetirizine) daily as needed. May take twice a day during allergy flares. May switch antihistamines every few months. Use Flonase (fluticasone) nasal spray 1 spray per nostril twice a day as needed for nasal congestion.  Nasal saline spray (i.e., Simply Saline) or nasal saline lavage (i.e., NeilMed) is recommended as needed and prior to medicated nasal sprays. Consider allergy injections for long term control if above medications do not help the symptoms - handout given.   Follow up in 12 months or sooner if needed.   Reducing Pollen Exposure Pollen seasons: trees (spring), grass (summer) and ragweed/weeds (fall). Keep windows closed in your home and car to lower pollen exposure.  Install air conditioning in the bedroom and throughout the house if possible.  Avoid going out in dry windy days - especially early morning. Pollen counts are highest between 5 - 10 AM and on dry, hot and windy days.  Save outside activities for late afternoon or after a heavy rain, when pollen levels are lower.  Avoid mowing of grass if you have grass pollen allergy. Be aware that pollen can also be transported indoors on people and pets.  Dry your clothes in an automatic dryer rather than hanging them outside where they might collect pollen.  Rinse hair and eyes before bedtime.  Mold Control Mold and fungi can grow on a variety of surfaces provided certain temperature and moisture conditions exist.  Outdoor molds grow on plants, decaying vegetation and soil. The major outdoor mold, Alternaria and Cladosporium, are found in very high numbers during hot and dry conditions. Generally, a late summer - fall peak is seen for common outdoor  fungal spores. Rain will temporarily lower outdoor mold spore count, but counts rise rapidly when the rainy period ends. The most important indoor molds are Aspergillus and Penicillium. Dark, humid and poorly ventilated basements are ideal sites for mold growth. The next most common sites of mold growth are the bathroom and the kitchen. Outdoor (Seasonal) Mold Control Use air conditioning and keep windows closed. Avoid exposure to decaying vegetation. Avoid leaf raking. Avoid grain handling. Consider wearing a face mask if working in moldy areas.  Indoor (Perennial) Mold Control  Maintain humidity below 50%. Get rid of mold growth on hard surfaces with water, detergent and, if necessary, 5% bleach (do not mix with other cleaners). Then dry the area completely. If mold covers an area more than 10 square feet, consider hiring an indoor environmental professional. For clothing, washing with soap and water is best. If moldy items cannot be cleaned and dried, throw them away. Remove sources e.g. contaminated carpets. Repair and seal leaking roofs or pipes. Using dehumidifiers in damp basements may be helpful, but empty the water and clean units regularly to prevent mildew from forming. All rooms, especially basements, bathrooms and kitchens, require ventilation and cleaning to deter mold and mildew growth. Avoid carpeting on concrete or damp floors, and storing items in damp areas. Cockroach Allergen Avoidance Cockroaches are often found in the homes of densely populated urban areas, schools or commercial buildings, but these creatures can lurk almost anywhere. This does not mean that you have a dirty house or living area. Block all areas where roaches can enter the home. This includes crevices, wall cracks and windows.  Cockroaches need water to survive,  so fix and seal all leaky faucets and pipes. Have an exterminator go through the house when your family and pets are gone to eliminate any remaining  roaches. Keep food in lidded containers and put pet food dishes away after your pets are done eating. Vacuum and sweep the floor after meals, and take out garbage and recyclables. Use lidded garbage containers in the kitchen. Wash dishes immediately after use and clean under stoves, refrigerators or toasters where crumbs can accumulate. Wipe off the stove and other kitchen surfaces and cupboards regularly.

## 2021-09-23 ENCOUNTER — Ambulatory Visit: Payer: Medicaid Other | Admitting: Allergy

## 2021-10-14 ENCOUNTER — Ambulatory Visit: Payer: Medicaid Other | Admitting: Allergy

## 2021-11-10 DIAGNOSIS — H1010 Acute atopic conjunctivitis, unspecified eye: Secondary | ICD-10-CM | POA: Insufficient documentation

## 2021-11-10 NOTE — Progress Notes (Unsigned)
Follow Up Note  RE: Lisa Brandt MRN: [id number] DOB: 07/16/2000 Date of Office Visit: 11/11/2021  Referring provider: Steffanie Rainwater, MD Primary care provider: Steffanie Rainwater, MD  Chief Complaint: No chief complaint on file.  History of Present Illness: I had the pleasure of seeing Lisa Brandt for a follow up visit at the [facility] on 11/10/2021. She is a 21 y.o. female, who is being followed for allergic rhinoconjunctivitis. Her previous allergy office visit was on 09/22/2020 with Dr. Selena Batten. Today is a regular follow up visit.  Other allergic rhinitis Past history - Rhinoconjunctivitis symptoms in the spring and takes over-the-counter allergy medications with good benefit. Interim history - uses zyrtec and Flonase prn with good benefit.  Today's skin testing showed: Positive to grass, weed, ragweed, trees, mold, cockroach. Start environmental control measures as below. Use over the counter antihistamines such as Zyrtec (cetirizine), Claritin (loratadine), Allegra (fexofenadine), or Xyzal (levocetirizine) daily as needed. May take twice a day during allergy flares. May switch antihistamines every few months. Use Flonase (fluticasone) nasal spray 1 spray per nostril twice a day as needed for nasal congestion.  Nasal saline spray (i.e., Simply Saline) or nasal saline lavage (i.e., NeilMed) is recommended as needed and prior to medicated nasal sprays. Consider allergy injections for long term control if above medications do not help the symptoms - handout given.    Return in about 1 year (around 09/22/2021).  Assessment and Plan: Lisa Brandt is a 21 y.o. female with: No problem-specific Assessment & Plan notes found for this encounter.  No follow-ups on file.  No orders of the defined types were placed in this encounter.  Lab Orders  No laboratory test(s) ordered today    Diagnostics: Spirometry:  Tracings reviewed. Her effort: {Blank  single:19197::"Good reproducible efforts.","It was hard to get consistent efforts and there is a question as to whether this reflects a maximal maneuver.","Poor effort, data can not be interpreted."} FVC: ***L FEV1: ***L, ***% predicted FEV1/FVC ratio: ***% Interpretation: {Blank single:19197::"Spirometry consistent with mild obstructive disease","Spirometry consistent with moderate obstructive disease","Spirometry consistent with severe obstructive disease","Spirometry consistent with possible restrictive disease","Spirometry consistent with mixed obstructive and restrictive disease","Spirometry uninterpretable due to technique","Spirometry consistent with normal pattern","No overt abnormalities noted given today's efforts"}.  Please see scanned spirometry results for details.  Skin Testing: {Blank single:19197::"Select foods","Environmental allergy panel","Environmental allergy panel and select foods","Food allergy panel","None","Deferred due to recent antihistamines use"}. *** Results discussed with patient/family.   Medication List:  Current Outpatient Medications  Medication Sig Dispense Refill  . acetaminophen (TYLENOL) 325 MG tablet Take 650 mg by mouth every 6 (six) hours as needed for mild pain or headache.    . cetirizine (ZYRTEC ALLERGY) 10 MG tablet Take 1 tablet (10 mg total) by mouth daily. 30 tablet 5  . ELDERBERRY PO Take 1 each by mouth in the morning, at noon, and at bedtime.    . fluticasone (FLONASE) 50 MCG/ACT nasal spray Place 1 spray into both nostrils 2 (two) times daily as needed for allergies or rhinitis. 16 g 5  . ondansetron (ZOFRAN) 4 MG tablet Take 1 tablet (4 mg total) by mouth every 6 (six) hours as needed for nausea. (Patient not taking: No sig reported) 20 tablet 0  . Phenylephrine-Pheniramine-DM (THERAFLU COLD & COUGH PO) Take 30 mLs by mouth 3 (three) times daily as needed (flu symptoms).     No current facility-administered medications for this visit.    Allergies: No Known Allergies I reviewed her  past medical history, social history, family history, and environmental history and no significant changes have been reported from her previous visit.  Review of Systems  Constitutional:  Negative for appetite change, chills, fever and unexpected weight change.  HENT:  Negative for congestion and rhinorrhea.   Eyes:  Negative for itching.  Respiratory:  Negative for cough, chest tightness, shortness of breath and wheezing.   Cardiovascular:  Negative for chest pain.  Gastrointestinal:  Negative for abdominal pain.  Genitourinary:  Negative for difficulty urinating.  Skin:  Negative for rash.  Allergic/Immunologic: Positive for environmental allergies.  Neurological:  Negative for headaches.   Objective: There were no vitals taken for this visit. There is no height or weight on file to calculate BMI. Physical Exam Vitals and nursing note reviewed.  Constitutional:      Appearance: Normal appearance. She is well-developed.  HENT:     Head: Normocephalic and atraumatic.     Right Ear: Tympanic membrane and external ear normal.     Left Ear: Tympanic membrane and external ear normal.     Nose: Congestion (on left side) present.     Comments: Transverse nasal crease    Mouth/Throat:     Mouth: Mucous membranes are moist.     Pharynx: Oropharynx is clear.  Eyes:     Conjunctiva/sclera: Conjunctivae normal.  Cardiovascular:     Rate and Rhythm: Normal rate and regular rhythm.     Heart sounds: Normal heart sounds. No murmur heard.    No friction rub. No gallop.  Pulmonary:     Effort: Pulmonary effort is normal.     Breath sounds: Normal breath sounds. No wheezing, rhonchi or rales.  Musculoskeletal:     Cervical back: Neck supple.  Skin:    General: Skin is warm.     Findings: No rash.  Neurological:     Mental Status: She is alert and oriented to person, place, and time.  Psychiatric:        Behavior: Behavior normal.   Previous notes and tests were reviewed. The plan was reviewed with the patient/family, and all questions/concerned were addressed.  It was my pleasure to see Lisa Brandt today and participate in her care. Please feel free to contact me with any questions or concerns.  Sincerely,  Wyline Mood, DO Allergy & Immunology  [facility] office: [phone] [facility] office: [phone]

## 2021-11-11 ENCOUNTER — Ambulatory Visit (INDEPENDENT_AMBULATORY_CARE_PROVIDER_SITE_OTHER): Payer: Medicaid Other | Admitting: Allergy

## 2021-11-11 ENCOUNTER — Encounter: Payer: Self-pay | Admitting: Allergy

## 2021-11-11 VITALS — BP 116/84 | HR 83 | Temp 97.9°F | Resp 18 | Ht 67.5 in | Wt 270.4 lb

## 2021-11-11 DIAGNOSIS — J302 Other seasonal allergic rhinitis: Secondary | ICD-10-CM | POA: Diagnosis not present

## 2021-11-11 DIAGNOSIS — H1013 Acute atopic conjunctivitis, bilateral: Secondary | ICD-10-CM | POA: Diagnosis not present

## 2021-11-11 DIAGNOSIS — R21 Rash and other nonspecific skin eruption: Secondary | ICD-10-CM | POA: Diagnosis not present

## 2021-11-11 DIAGNOSIS — H1010 Acute atopic conjunctivitis, unspecified eye: Secondary | ICD-10-CM

## 2021-11-11 MED ORDER — CETIRIZINE HCL 10 MG PO TABS: 10.0000 mg | ORAL_TABLET | Freq: Every day | ORAL | 5 refills | Status: DC

## 2021-11-11 MED ORDER — DESONIDE 0.05 % EX OINT
1.0000 | TOPICAL_OINTMENT | Freq: Two times a day (BID) | CUTANEOUS | 2 refills | Status: AC | PRN
Start: 2021-11-11 — End: ?

## 2021-11-11 NOTE — Assessment & Plan Note (Signed)
Itchy rash on neck - slowly improving. Denies changes in diet, meds, personal care products. . Does not feel or look like eczema. . Use desonide 0.05% ointment twice a day as needed for mild rash flares - okay to use on the face, neck, groin area. Do not use more than 1 week at a time. . See below for proper skin care. . If no improvement recommend dermatology evaluation next.

## 2021-11-11 NOTE — Patient Instructions (Addendum)
Environmental allergies 2022 skin testing showed: Positive to grass, weed, ragweed, trees, mold, cockroach. Continue environmental control measures as below. Use over the counter antihistamines such as Zyrtec (cetirizine), Claritin (loratadine), Allegra (fexofenadine), or Xyzal (levocetirizine) daily as needed. May take twice a day during allergy flares. May switch antihistamines every few months. Use Flonase (fluticasone) nasal spray 1 spray per nostril twice a day as needed for nasal congestion.  Nasal saline spray (i.e., Simply Saline) or nasal saline lavage (i.e., NeilMed) is recommended as needed and prior to medicated nasal sprays. Consider allergy injections for long term control if above medications do not help the symptoms.  Rash Does not feel/look like eczema. Use desonide 0.05% ointment twice a day as needed for mild rash flares - okay to use on the face, neck, groin area. Do not use more than 1 week at a time. See below for proper skin care. If no improvement recommend dermatology evaluation next.  Follow up in 12 months or sooner if needed.  Our [place] office is moving in September 2023 to a new location. New address: [address] [place], [place], [place] [address] (white building). [place] office: [phone] (same phone number).   Skin care recommendations  Bath time: Always use lukewarm water. AVOID very hot or cold water. Keep bathing time to 5-10 minutes. Do NOT use bubble bath. Use a mild soap and use just enough to wash the dirty areas. Do NOT scrub skin vigorously.  After bathing, pat dry your skin with a towel. Do NOT rub or scrub the skin.  Moisturizers and prescriptions:  ALWAYS apply moisturizers immediately after bathing (within 3 minutes). This helps to lock-in moisture. Use the moisturizer several times a day over the whole body. Good summer moisturizers include: Aveeno, CeraVe, Cetaphil. Good winter moisturizers include: Aquaphor, Vaseline, Cerave,  Cetaphil, Eucerin, Vanicream. When using moisturizers along with medications, the moisturizer should be applied about one hour after applying the medication to prevent diluting effect of the medication or moisturize around where you applied the medications. When not using medications, the moisturizer can be continued twice daily as maintenance.  Laundry and clothing: Avoid laundry products with added color or perfumes. Use unscented hypo-allergenic laundry products such as Tide free, Cheer free & gentle, and All free and clear.  If the skin still seems dry or sensitive, you can try double-rinsing the clothes. Avoid tight or scratchy clothing such as wool. Do not use fabric softeners or dyer sheets.  Reducing Pollen Exposure Pollen seasons: trees (spring), grass (summer) and ragweed/weeds (fall). Keep windows closed in your home and car to lower pollen exposure.  Install air conditioning in the bedroom and throughout the house if possible.  Avoid going out in dry windy days - especially early morning. Pollen counts are highest between 5 - 10 AM and on dry, hot and windy days.  Save outside activities for late afternoon or after a heavy rain, when pollen levels are lower.  Avoid mowing of grass if you have grass pollen allergy. Be aware that pollen can also be transported indoors on people and pets.  Dry your clothes in an automatic dryer rather than hanging them outside where they might collect pollen.  Rinse hair and eyes before bedtime.  Mold Control Mold and fungi can grow on a variety of surfaces provided certain temperature and moisture conditions exist.  Outdoor molds grow on plants, decaying vegetation and soil. The major outdoor mold, Alternaria and Cladosporium, are found in very high numbers during hot and dry conditions.  Generally, a late summer - fall peak is seen for common outdoor fungal spores. Rain will temporarily lower outdoor mold spore count, but counts rise rapidly when  the rainy period ends. The most important indoor molds are Aspergillus and Penicillium. Dark, humid and poorly ventilated basements are ideal sites for mold growth. The next most common sites of mold growth are the bathroom and the kitchen. Outdoor (Seasonal) Mold Control Use air conditioning and keep windows closed. Avoid exposure to decaying vegetation. Avoid leaf raking. Avoid grain handling. Consider wearing a face mask if working in moldy areas.  Indoor (Perennial) Mold Control  Maintain humidity below 50%. Get rid of mold growth on hard surfaces with water, detergent and, if necessary, 5% bleach (do not mix with other cleaners). Then dry the area completely. If mold covers an area more than 10 square feet, consider hiring an indoor environmental professional. For clothing, washing with soap and water is best. If moldy items cannot be cleaned and dried, throw them away. Remove sources e.g. contaminated carpets. Repair and seal leaking roofs or pipes. Using dehumidifiers in damp basements may be helpful, but empty the water and clean units regularly to prevent mildew from forming. All rooms, especially basements, bathrooms and kitchens, require ventilation and cleaning to deter mold and mildew growth. Avoid carpeting on concrete or damp floors, and storing items in damp areas. Cockroach Allergen Avoidance Cockroaches are often found in the homes of densely populated urban areas, schools or commercial buildings, but these creatures can lurk almost anywhere. This does not mean that you have a dirty house or living area. Block all areas where roaches can enter the home. This includes crevices, wall cracks and windows.  Cockroaches need water to survive, so fix and seal all leaky faucets and pipes. Have an exterminator go through the house when your family and pets are gone to eliminate any remaining roaches. Keep food in lidded containers and put pet food dishes away after your pets are done  eating. Vacuum and sweep the floor after meals, and take out garbage and recyclables. Use lidded garbage containers in the kitchen. Wash dishes immediately after use and clean under stoves, refrigerators or toasters where crumbs can accumulate. Wipe off the stove and other kitchen surfaces and cupboards regularly.

## 2021-11-11 NOTE — Assessment & Plan Note (Signed)
Past history - 2022 skin testing showed: Positive to grass, weed, ragweed, trees, mold, cockroach. Interim history - uses zyrtec and Flonase prn with good benefit in the spring. Not interested in AIT.  Continue environmental control measures as below.  Use over the counter antihistamines such as Zyrtec (cetirizine), Claritin (loratadine), Allegra (fexofenadine), or Xyzal (levocetirizine) daily as needed. May take twice a day during allergy flares. May switch antihistamines every few months.  Use Flonase (fluticasone) nasal spray 1 spray per nostril twice a day as needed for nasal congestion.   Nasal saline spray (i.e., Simply Saline) or nasal saline lavage (i.e., NeilMed) is recommended as needed and prior to medicated nasal sprays.  Consider allergy injections for long term control if above medications do not help the symptoms.

## 2021-11-13 ENCOUNTER — Telehealth: Payer: Self-pay

## 2021-11-13 NOTE — Telephone Encounter (Signed)
Drug change request received: Desonide 0.05% ointment is not covered by patient's insurance.  Alternatives: Hydrocortisone, Triamcinolone  Forwarding message to provider for next step.

## 2021-11-16 MED ORDER — HYDROCORTISONE 2.5 % EX CREA: TOPICAL_CREAM | Freq: Two times a day (BID) | CUTANEOUS | 2 refills | Status: AC | PRN

## 2021-11-16 NOTE — Telephone Encounter (Signed)
Sent in hydrocortisone rx.

## 2021-11-16 NOTE — Addendum Note (Signed)
Addended by: Ellamae Sia on: 11/16/2021 09:25 PM   Modules accepted: Orders

## 2021-12-03 ENCOUNTER — Encounter: Payer: Medicaid Other | Admitting: Student

## 2022-01-25 IMAGING — CT CT NECK W/ CM
4 of 6 series · 12 of 33 positions shown, 14 images · IV contrast (omnipaque)
Comparison: None.

CLINICAL DATA: Tonsil/adenoid disorder. Question peritonsillar
abscess.

EXAM:
CT NECK WITH CONTRAST
TECHNIQUE: Multidetector CT imaging of the neck was performed using the
standard protocol following the bolus administration of intravenous
contrast.
CONTRAST:  73mL OMNIPAQUE IOHEXOL 300 MG/ML  SOLN

[Series 3: axial neck · axial · 0.46mm/px · z∈[-271,-201]mm · 2 of 107 slices shown]
[im 36/107  bone]
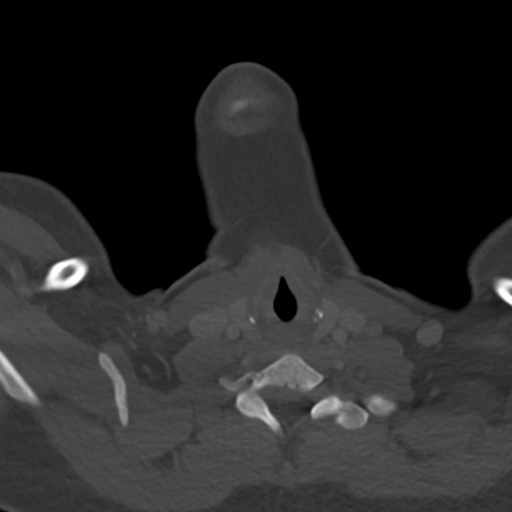
[im 71/107  bone]
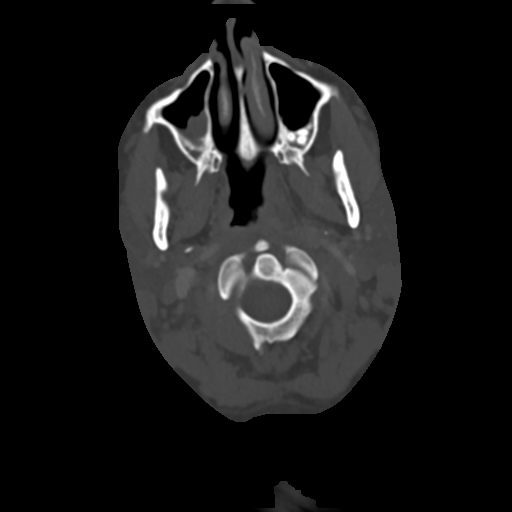

[Series 6: sag neck · sagittal · 0.40mm/px · 5 of 104 slices shown, 6 images]
[im 35/104  bone]
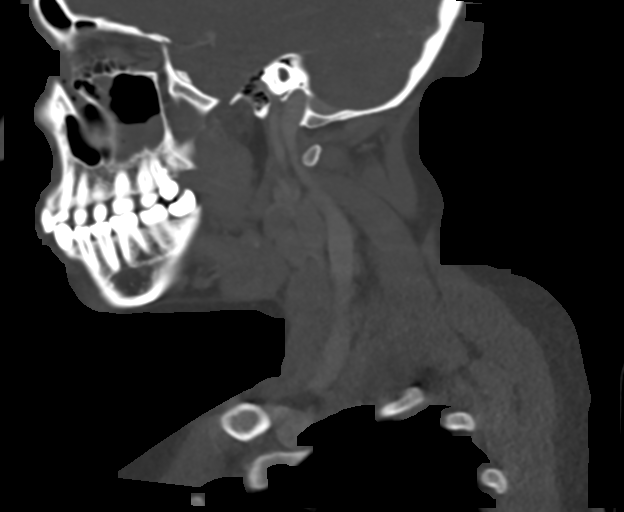
[im 43/104  bone]
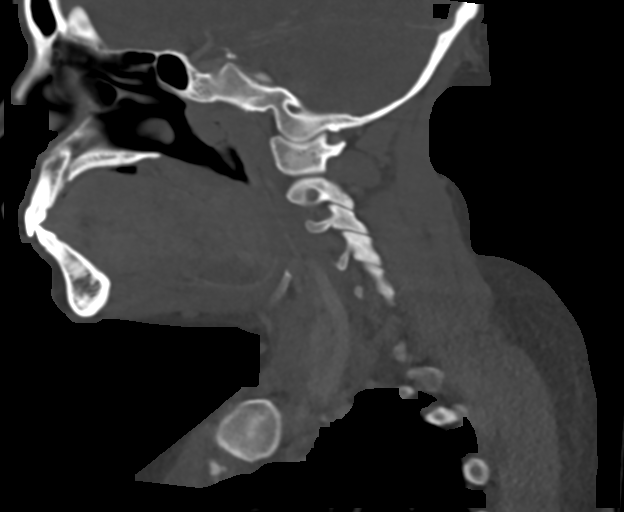
[im 52/104  soft-tissue]
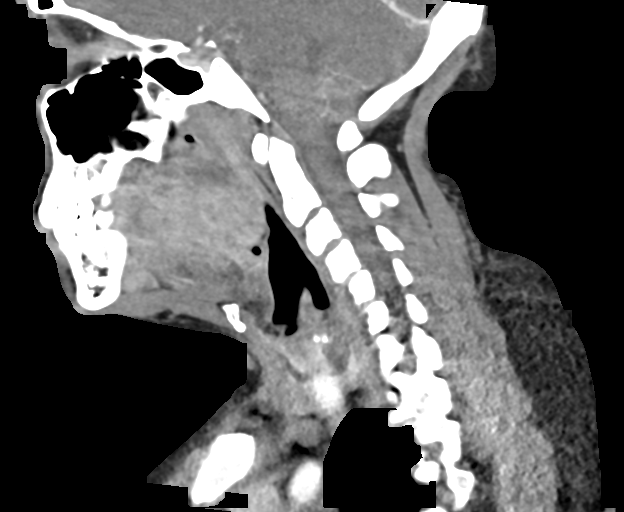
[im 52/104  bone]
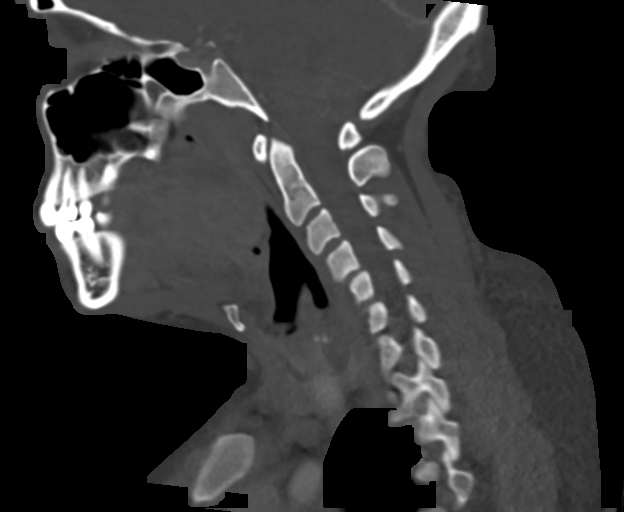
[im 61/104  bone]
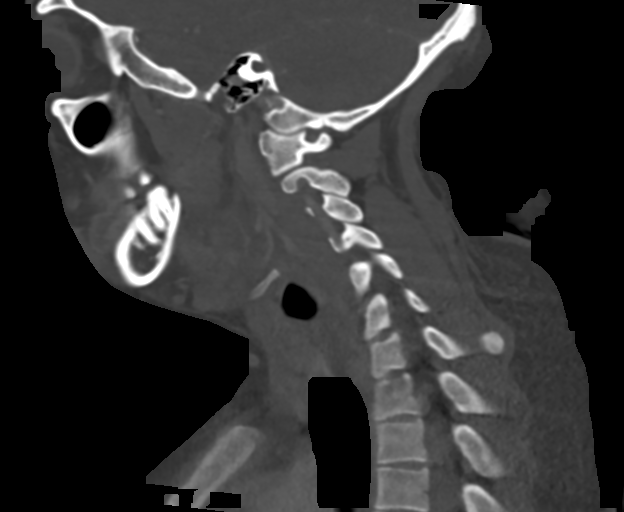
[im 69/104  bone]
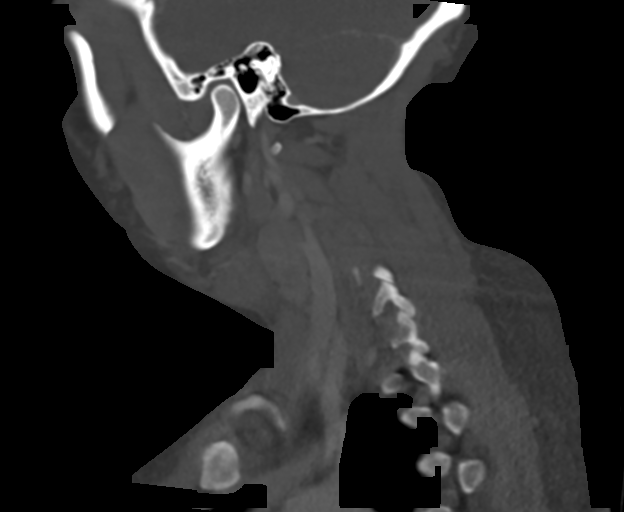

[Series 7: cor neck · coronal · 0.35mm/px · 3 of 133 slices shown]
[im 27/133  bone]
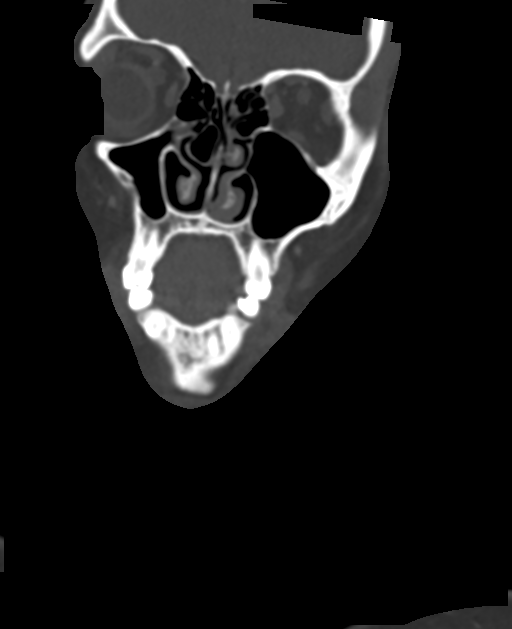
[im 53/133  bone]
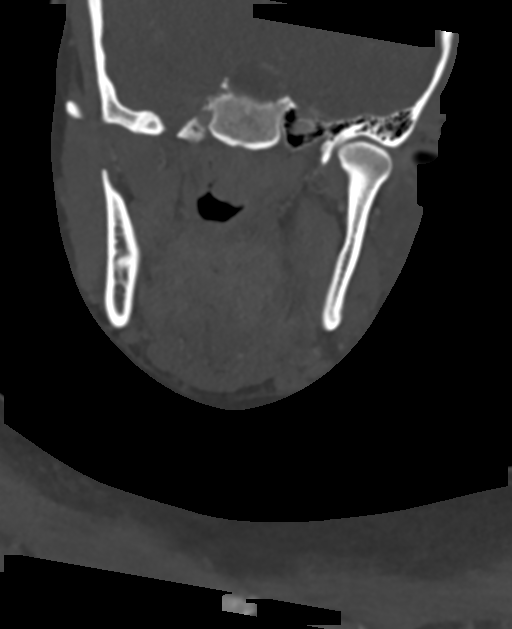
[im 80/133  bone]
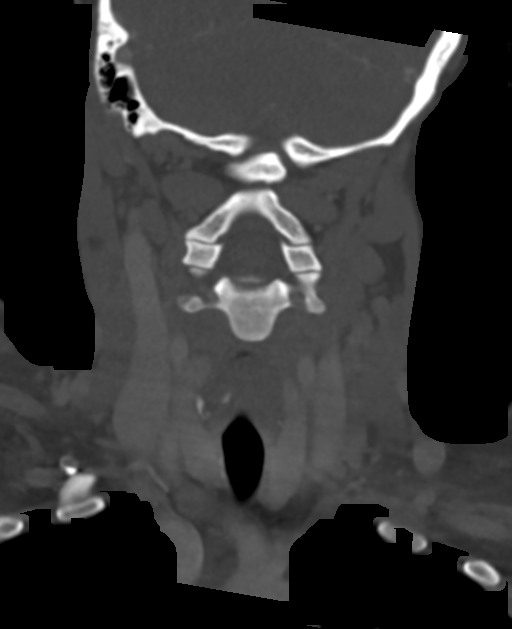

[Series 8: ax oropharynx · axial · 0.43mm/px · z∈[-278,-204]mm · 2 of 113 slices shown, 3 images]
[im 38/113  soft-tissue]
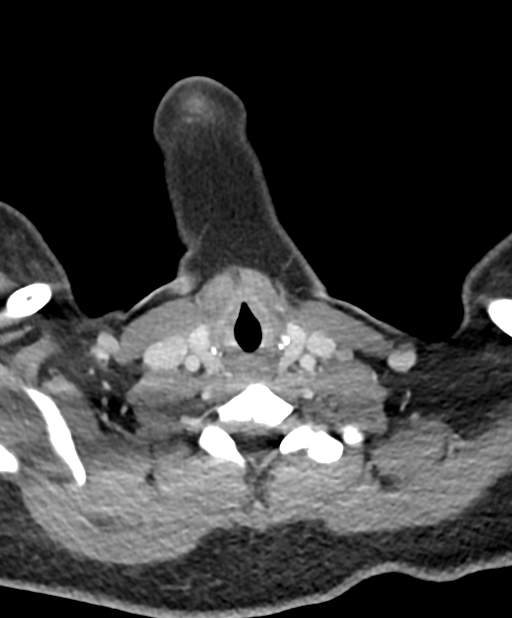
[im 38/113  bone]
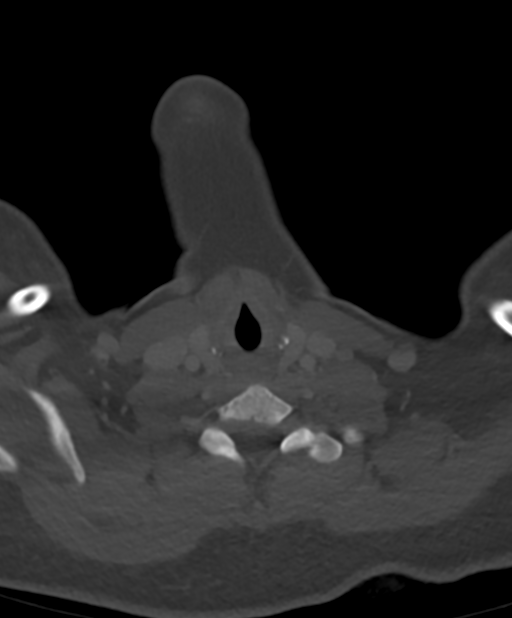
[im 75/113  bone]
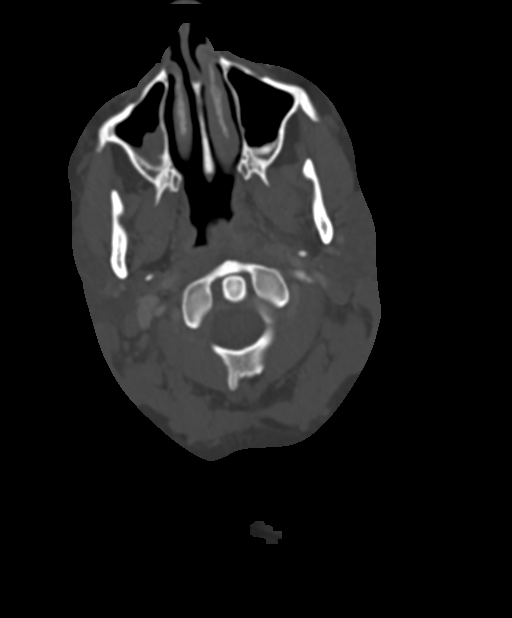

[12 of 33 positions shown; findings below may reference images not displayed]

FINDINGS: Pharynx and larynx: Bilateral tonsillar enlargement and
striated/heterogeneous enhancement, compatible with tonsillitis.
There is a 1.0 by 1.7 by 1.3 cm left peritonsillar fluid collection,
compatible with abscess (see series 3, image 48 and series 7, image
59). There is an adjacent smaller 6 mm fluid collection more
anteriorly and superiorly, compatible with an additional abscess
(see series 3, image 43). Stranding/edema extends inferiorly to the
base of the epiglottis, involving the left area epiglottic fold.
Edema extends laterally to involve the left parapharyngeal space and
left submandibular space. Change in there is associated mass effect
on the airway, which remains patent. Prominent adenoid soft tissue,
most likely adenoid hypertrophy.

Salivary glands: Inflammatory changes from the above process
extending to involve the left submandibular space and surround the
left submandibular gland. The submandibular gland itself appears
unremarkable and symmetric. The parotid glands are normal.

Thyroid: Normal.

Lymph nodes: Bilateral upper cervical lymphadenopathy without
specific evidence of suppuration.

Vascular: Grossly patent.

Limited intracranial: Negative.

Visualized orbits: Negative.

Mastoids and visualized paranasal sinuses: Mucosal thickening of the
inferior right maxillary sinus. Remaining sinuses are largely clear.
No mastoid effusions.

Skeleton: No acute or aggressive process.

Upper chest: Negative.
IMPRESSION: 1. Findings consistent with tonsillitis with two left peritonsillar
abscesses, as detailed above. Edema extends inferiorly to involve
the base of the left epiglottis/aryepiglottic fold and
inferolaterally to involve the parapharyngeal and submandibular
spaces. Associated mass effect on the pharyngeal airway, which
remains patent.
2. Cervical lymphadenopathy without evidence of suppuration.
3. Prominent adenoid soft tissue. This likely represents adenoid
hypertrophy; however, recommend correlation with direct inspection.

## 2022-07-14 ENCOUNTER — Ambulatory Visit (INDEPENDENT_AMBULATORY_CARE_PROVIDER_SITE_OTHER): Payer: Self-pay | Admitting: Nurse Practitioner

## 2022-07-14 ENCOUNTER — Encounter: Payer: Self-pay | Admitting: Nurse Practitioner

## 2022-07-14 VITALS — BP 120/70 | HR 109 | Temp 98.2°F | Ht 68.0 in | Wt 273.4 lb

## 2022-07-14 DIAGNOSIS — J302 Other seasonal allergic rhinitis: Secondary | ICD-10-CM

## 2022-07-14 DIAGNOSIS — J3089 Other allergic rhinitis: Secondary | ICD-10-CM

## 2022-07-14 DIAGNOSIS — E669 Obesity, unspecified: Secondary | ICD-10-CM | POA: Insufficient documentation

## 2022-07-14 DIAGNOSIS — L732 Hidradenitis suppurativa: Secondary | ICD-10-CM | POA: Insufficient documentation

## 2022-07-14 MED ORDER — FLUTICASONE PROPIONATE 50 MCG/ACT NA SUSP: 1.0000 | Freq: Two times a day (BID) | NASAL | 5 refills | Status: AC | PRN

## 2022-07-14 MED ORDER — CETIRIZINE HCL 10 MG PO TABS: 10.0000 mg | ORAL_TABLET | Freq: Every day | ORAL | 1 refills | Status: AC

## 2022-07-14 NOTE — Assessment & Plan Note (Signed)
Wt Readings from Last 3 Encounters:  07/14/22 273 lb 6.4 oz (124 kg)  11/11/21 270 lb 6 oz (122.6 kg)  09/22/20 233 lb 3.2 oz (105.8 kg)   Body mass index is 41.57 kg/m.   Patient counseled on low-carb modified diet She was encouraged to engage in regular moderate to vigorous exercise with at least 150 minutes weekly Checking fasting labs in the months and will refer to medical weight management clinic

## 2022-07-14 NOTE — Patient Instructions (Signed)
1. Seasonal and perennial allergic rhinoconjunctivitis  - cetirizine (ZYRTEC ALLERGY) 10 MG tablet; Take 1 tablet (10 mg total) by mouth daily.  Dispense: 90 tablet; Refill: 1  2. Class 3 severe obesity in adult, unspecified BMI, unspecified obesity type, unspecified whether serious comorbidity present (HCC)   3. Hidradenitis suppurativa  - Ambulatory referral to Dermatology     It is important that you exercise regularly at least 30 minutes 5 times a week as tolerated  Think about what you will eat, plan ahead. Choose " clean, green, fresh or frozen" over canned, processed or packaged foods which are more sugary, salty and fatty. 70 to 75% of food eaten should be vegetables and fruit. Three meals at set times with snacks allowed between meals, but they must be fruit or vegetables. Aim to eat over a 12 hour period , example 7 am to 7 pm, and STOP after  your last meal of the day. Drink water,generally about 64 ounces per day, no other drink is as healthy. Fruit juice is best enjoyed in a healthy way, by EATING the fruit.  Thanks for choosing [facility] we consider it a privelige to serve you.

## 2022-07-14 NOTE — Progress Notes (Signed)
New Patient Office Visit  Subjective:  Patient ID: Lisa Brandt, female    DOB: 2000-08-04  Age: 22 y.o. MRN: [id number]  CC:  Chief Complaint  Patient presents with   Establish Care   Allergies    HPI Lisa Brandt is a 22 y.o. female with past medical history of seasonal and perennial allergies who presents to establish care.  Takes Flonase nasal spray but has been out of cetirizine.  She complains of stuffy nose, nasal drainage itchy throat some wheezing she denies fever, chills, malaise.  Patient complains of hyperpigmented skin on inner thigh.  She denies itching, drainage  She goes to the gym 3 times weekly for exercise, reports that her diet can be better.    Due for cervical Pap exam she will follow-up in 4 weeks for annual physical with Pap  Thinks that she is up-to-date with HPV vaccine and Tdap vaccine will request records from previous PCP today.    Past Medical History:  Diagnosis Date   Allergy    Eczema    Peritonsillar abscess 02/28/2020   Patient hospitalized for 1 day and received IV clindamycin   Strep pharyngitis    Urticaria     History reviewed. No pertinent surgical history.  Family History  Problem Relation Age of Onset   Healthy Mother    Healthy Father    Diabetes Paternal Grandfather     Social History   Socioeconomic History   Marital status: Single    Spouse name: Not on file   Number of children: Not on file   Years of education: Not on file   Highest education level: Not on file  Occupational History   Not on file  Tobacco Use   Smoking status: Never   Smokeless tobacco: Never  Vaping Use   Vaping Use: Never used  Substance and Sexual Activity   Alcohol use: Yes    Comment: occasionally   Drug use: Never   Sexual activity: Yes    Birth control/protection: Condom  Other Topics Concern   Not on file  Social History Narrative   Lives with her boyfriend    Social Determinants of Health   Financial Resource  Strain: Not on file  Food Insecurity: Not on file  Transportation Needs: Not on file  Physical Activity: Not on file  Stress: Not on file  Social Connections: Not on file  Intimate Partner Violence: Not on file    ROS Review of Systems  Constitutional:  Negative for activity change, appetite change, chills, fatigue and fever.  HENT:  Positive for congestion, rhinorrhea and sneezing. Negative for dental problem, ear discharge, ear pain, hearing loss, sinus pressure, sinus pain and sore throat.   Eyes:  Negative for pain, discharge, redness and itching.  Respiratory:  Negative for cough, chest tightness, shortness of breath and wheezing.   Cardiovascular:  Negative for chest pain, palpitations and leg swelling.  Gastrointestinal:  Negative for abdominal distention, abdominal pain, anal bleeding, blood in stool, constipation, diarrhea, nausea, rectal pain and vomiting.  Endocrine: Negative for cold intolerance, heat intolerance, polydipsia, polyphagia and polyuria.  Genitourinary:  Negative for difficulty urinating, dysuria, flank pain, frequency, hematuria, menstrual problem, pelvic pain and vaginal bleeding.  Musculoskeletal:  Negative for arthralgias, back pain, gait problem, joint swelling and myalgias.  Skin:  Positive for rash. Negative for color change, pallor and wound.  Allergic/Immunologic: Negative for environmental allergies, food allergies and immunocompromised state.  Neurological:  Negative for dizziness, tremors, facial asymmetry, weakness and  headaches.  Hematological:  Negative for adenopathy. Does not bruise/bleed easily.  Psychiatric/Behavioral:  Negative for agitation, behavioral problems, confusion, decreased concentration, hallucinations, self-injury and suicidal ideas.     Objective:   Today's Vitals: BP 120/70   Pulse (!) 109   Temp 98.2 F (36.8 C)   Ht 5\' 8"  (1.727 m)   Wt 273 lb 6.4 oz (124 kg)   LMP 06/17/2022 (Approximate)   SpO2 97%   BMI 41.57 kg/m    Physical Exam Vitals and nursing note reviewed. Exam conducted with a chaperone present.  Constitutional:      General: She is not in acute distress.    Appearance: Normal appearance. She is obese. She is not ill-appearing, toxic-appearing or diaphoretic.  HENT:     Right Ear: Tympanic membrane, ear canal and external ear normal. There is no impacted cerumen.     Left Ear: Tympanic membrane, ear canal and external ear normal. There is no impacted cerumen.     Nose: Congestion and rhinorrhea present.     Mouth/Throat:     Mouth: Mucous membranes are moist.     Pharynx: Oropharynx is clear. No oropharyngeal exudate or posterior oropharyngeal erythema.  Eyes:     General: No scleral icterus.       Right eye: No discharge.        Left eye: No discharge.     Extraocular Movements: Extraocular movements intact.     Conjunctiva/sclera: Conjunctivae normal.  Cardiovascular:     Rate and Rhythm: Normal rate and regular rhythm.     Pulses: Normal pulses.     Heart sounds: Normal heart sounds. No murmur heard.    No friction rub. No gallop.  Pulmonary:     Effort: Pulmonary effort is normal. No respiratory distress.     Breath sounds: Normal breath sounds. No stridor. No wheezing, rhonchi or rales.  Chest:     Chest wall: No tenderness.  Abdominal:     General: There is no distension.     Palpations: Abdomen is soft.     Tenderness: There is no abdominal tenderness. There is no right CVA tenderness, left CVA tenderness or guarding.  Musculoskeletal:        General: No swelling, tenderness, deformity or signs of injury.     Right lower leg: No edema.     Left lower leg: No edema.  Skin:    General: Skin is warm and dry.     Capillary Refill: Capillary refill takes 2 to 3 seconds.     Coloration: Skin is not jaundiced or pale.     Findings: Lesion present. No bruising or erythema.     Comments: Lesion consistent with hidradenitis superlativa noted on the bilateral axillary and  bilateral inner thigh area.  No redness, swelling discharge noted  Neurological:     Mental Status: She is alert and oriented to person, place, and time.     Motor: No weakness.     Coordination: Coordination normal.     Gait: Gait normal.  Psychiatric:        Mood and Affect: Mood normal.        Behavior: Behavior normal.        Thought Content: Thought content normal.        Judgment: Judgment normal.     Assessment & Plan:   Problem List Items Addressed This Visit       Respiratory   Seasonal and perennial allergic rhinoconjunctivitis - Primary  1. Seasonal and perennial allergic rhinitis  - cetirizine (ZYRTEC ALLERGY) 10 MG tablet; Take 1 tablet (10 mg total) by mouth daily.  Dispense: 90 tablet; Refill: 1  Flonase nasal spray 2 spray into both nostrils refilled  Avoid allergens      Relevant Medications   cetirizine (ZYRTEC ALLERGY) 10 MG tablet   fluticasone (FLONASE) 50 MCG/ACT nasal spray     Musculoskeletal and Integument   Hidradenitis suppurativa    No sign of infection noted on examination today Patient encouraged to lose weight avoid shaving keep skin clean and dry Educational material provided Patient would like referral to dermatology referral placed      Relevant Orders   Ambulatory referral to Dermatology     Other   Obesity    Wt Readings from Last 3 Encounters:  07/14/22 273 lb 6.4 oz (124 kg)  11/11/21 270 lb 6 oz (122.6 kg)  09/22/20 233 lb 3.2 oz (105.8 kg)   Body mass index is 41.57 kg/m.   Patient counseled on low-carb modified diet She was encouraged to engage in regular moderate to vigorous exercise with at least 150 minutes weekly Checking fasting labs in the months and will refer to medical weight management clinic       Outpatient Encounter Medications as of 07/14/2022  Medication Sig   [DISCONTINUED] fluticasone (FLONASE) 50 MCG/ACT nasal spray Place 1 spray into both nostrils 2 (two) times daily as needed for allergies or  rhinitis.   [DISCONTINUED] ondansetron (ZOFRAN) 4 MG tablet Take 1 tablet (4 mg total) by mouth every 6 (six) hours as needed for nausea.   acetaminophen (TYLENOL) 325 MG tablet Take 650 mg by mouth every 6 (six) hours as needed for mild pain or headache. (Patient not taking: Reported on 07/14/2022)   cetirizine (ZYRTEC ALLERGY) 10 MG tablet Take 1 tablet (10 mg total) by mouth daily.   desonide (DESOWEN) 0.05 % ointment Apply 1 Application topically 2 (two) times daily as needed (mild rash flare). Okay to use on the face, neck, groin area. Do not use more than 1 week at a time. (Patient not taking: Reported on 07/14/2022)   ELDERBERRY PO Take 1 each by mouth in the morning, at noon, and at bedtime. (Patient not taking: Reported on 07/14/2022)   fluticasone (FLONASE) 50 MCG/ACT nasal spray Place 1 spray into both nostrils 2 (two) times daily as needed for allergies or rhinitis.   hydrocortisone 2.5 % cream Apply topically 2 (two) times daily as needed (rash). (Patient not taking: Reported on 07/14/2022)   [DISCONTINUED] cetirizine (ZYRTEC ALLERGY) 10 MG tablet Take 1 tablet (10 mg total) by mouth daily. (Patient not taking: Reported on 07/14/2022)   [DISCONTINUED] Phenylephrine-Pheniramine-DM (THERAFLU COLD & COUGH PO) Take 30 mLs by mouth 3 (three) times daily as needed (flu symptoms). (Patient not taking: Reported on 07/14/2022)   No facility-administered encounter medications on file as of 07/14/2022.    Follow-up: Return in about 4 weeks (around 08/11/2022) for CPE PAP.   Donell Beers, FNP

## 2022-07-14 NOTE — Assessment & Plan Note (Signed)
1. Seasonal and perennial allergic rhinitis  - cetirizine (ZYRTEC ALLERGY) 10 MG tablet; Take 1 tablet (10 mg total) by mouth daily.  Dispense: 90 tablet; Refill: 1  Flonase nasal spray 2 spray into both nostrils refilled  Avoid allergens

## 2022-07-14 NOTE — Assessment & Plan Note (Addendum)
No sign of infection noted on examination today Patient encouraged to lose weight avoid shaving keep skin clean and dry Educational material provided Patient would like referral to dermatology referral placed

## 2022-08-11 ENCOUNTER — Ambulatory Visit: Payer: Self-pay | Admitting: Nurse Practitioner

## 2022-08-24 ENCOUNTER — Ambulatory Visit: Payer: Self-pay | Admitting: Nurse Practitioner

## 2022-09-03 ENCOUNTER — Encounter: Payer: Self-pay | Admitting: Nurse Practitioner

## 2022-09-03 ENCOUNTER — Ambulatory Visit (INDEPENDENT_AMBULATORY_CARE_PROVIDER_SITE_OTHER): Payer: Commercial Managed Care - PPO | Admitting: Nurse Practitioner

## 2022-09-03 ENCOUNTER — Other Ambulatory Visit (HOSPITAL_COMMUNITY)
Admission: RE | Admit: 2022-09-03 | Discharge: 2022-09-03 | Disposition: A | Discharge status: Home / Self Care | Payer: Commercial Managed Care - PPO | Source: Ambulatory Visit | Attending: Nurse Practitioner | Admitting: Nurse Practitioner

## 2022-09-03 VITALS — BP 123/74 | HR 77 | Ht 68.0 in | Wt 277.0 lb

## 2022-09-03 DIAGNOSIS — L732 Hidradenitis suppurativa: Secondary | ICD-10-CM

## 2022-09-03 DIAGNOSIS — J302 Other seasonal allergic rhinitis: Secondary | ICD-10-CM

## 2022-09-03 DIAGNOSIS — H1010 Acute atopic conjunctivitis, unspecified eye: Secondary | ICD-10-CM

## 2022-09-03 DIAGNOSIS — Z124 Encounter for screening for malignant neoplasm of cervix: Secondary | ICD-10-CM | POA: Insufficient documentation

## 2022-09-03 DIAGNOSIS — Z1321 Encounter for screening for nutritional disorder: Secondary | ICD-10-CM

## 2022-09-03 DIAGNOSIS — Z130 Encounter for screening for diseases of the blood and blood-forming organs and certain disorders involving the immune mechanism: Secondary | ICD-10-CM

## 2022-09-03 DIAGNOSIS — Z0000 Encounter for general adult medical examination without abnormal findings: Secondary | ICD-10-CM | POA: Diagnosis not present

## 2022-09-03 DIAGNOSIS — Z1329 Encounter for screening for other suspected endocrine disorder: Secondary | ICD-10-CM | POA: Diagnosis not present

## 2022-09-03 DIAGNOSIS — Z13228 Encounter for screening for other metabolic disorders: Secondary | ICD-10-CM

## 2022-09-03 DIAGNOSIS — Z6841 Body Mass Index (BMI) 40.0 and over, adult: Secondary | ICD-10-CM

## 2022-09-03 DIAGNOSIS — J3089 Other allergic rhinitis: Secondary | ICD-10-CM

## 2022-09-03 NOTE — Progress Notes (Signed)
Complete physical exam  Patient: Lisa Brandt   DOB: 2000/04/25   22 y.o. Female  MRN: [id number]  Subjective:    No chief complaint on file.   Chere Babson is a 22 y.o. female  has a past medical history of Allergy, Eczema, Peritonsillar abscess (02/28/2020), Strep pharyngitis, and Urticaria. who presents today for a complete physical exam. She reports consuming a general diet. Goes to the [facility] 3 days a week , working toward 5 days a week. She generally feels well. She reports sleeping well. She does not have additional problems to discuss today.    Most recent fall risk assessment:    09/03/2022    8:35 AM  Fall Risk   Falls in the past year? 0  Number falls in past yr: 0  Injury with Fall? 0     Most recent depression screenings:    09/03/2022    8:35 AM 07/14/2022    3:09 PM  PHQ 2/9 Scores  PHQ - 2 Score 0 0        Patient Care Team: Donell Beers, FNP as PCP - General (Nurse Practitioner)   Outpatient Medications Prior to Visit  Medication Sig   acetaminophen (TYLENOL) 325 MG tablet Take 650 mg by mouth every 6 (six) hours as needed for mild pain or headache.   cetirizine (ZYRTEC ALLERGY) 10 MG tablet Take 1 tablet (10 mg total) by mouth daily.   desonide (DESOWEN) 0.05 % ointment Apply 1 Application topically 2 (two) times daily as needed (mild rash flare). Okay to use on the face, neck, groin area. Do not use more than 1 week at a time.   ELDERBERRY PO Take 1 each by mouth in the morning, at noon, and at bedtime.   fluticasone (FLONASE) 50 MCG/ACT nasal spray Place 1 spray into both nostrils 2 (two) times daily as needed for allergies or rhinitis.   hydrocortisone 2.5 % cream Apply topically 2 (two) times daily as needed (rash).   No facility-administered medications prior to visit.    Review of Systems  Constitutional:  Negative for activity change, appetite change, chills, fatigue and fever.  HENT:  Negative for congestion, dental problem, ear  discharge, ear pain, hearing loss, rhinorrhea, sinus pressure, sinus pain, sneezing and sore throat.   Eyes:  Negative for pain, discharge, redness and itching.  Respiratory:  Negative for cough, chest tightness, shortness of breath and wheezing.   Cardiovascular:  Negative for chest pain, palpitations and leg swelling.  Gastrointestinal:  Negative for abdominal distention, abdominal pain, anal bleeding, blood in stool, constipation, diarrhea, nausea, rectal pain and vomiting.  Endocrine: Negative for cold intolerance, heat intolerance, polydipsia, polyphagia and polyuria.  Genitourinary:  Negative for difficulty urinating, dysuria, flank pain, frequency, hematuria, menstrual problem, pelvic pain and vaginal bleeding.  Musculoskeletal:  Negative for arthralgias, back pain, gait problem, joint swelling and myalgias.  Skin:  Negative for color change, pallor, rash and wound.  Allergic/Immunologic: Negative for environmental allergies, food allergies and immunocompromised state.  Neurological:  Negative for dizziness, tremors, facial asymmetry, weakness and headaches.  Hematological:  Negative for adenopathy. Does not bruise/bleed easily.  Psychiatric/Behavioral:  Negative for agitation, behavioral problems, confusion, decreased concentration, hallucinations, self-injury and suicidal ideas.        Objective:     BP 123/74 (BP Location: Left Arm, Patient Position: Sitting, Cuff Size: Large)   Pulse 77   Ht 5\' 8"  (1.727 m)   Wt 277 lb (125.6 kg)   LMP 08/27/2022  SpO2 96%   BMI 42.12 kg/m    Physical Exam Vitals and nursing note reviewed. Exam conducted with a chaperone present.  Constitutional:      General: She is not in acute distress.    Appearance: Normal appearance. She is obese. She is not ill-appearing, toxic-appearing or diaphoretic.  HENT:     Right Ear: Tympanic membrane, ear canal and external ear normal. There is no impacted cerumen.     Left Ear: Tympanic membrane, ear  canal and external ear normal. There is no impacted cerumen.     Nose: Nose normal. No congestion or rhinorrhea.     Mouth/Throat:     Mouth: Mucous membranes are moist.     Pharynx: Oropharynx is clear. No oropharyngeal exudate or posterior oropharyngeal erythema.  Eyes:     General: No scleral icterus.       Right eye: No discharge.        Left eye: No discharge.     Extraocular Movements: Extraocular movements intact.     Conjunctiva/sclera: Conjunctivae normal.  Neck:     Vascular: No carotid bruit.  Cardiovascular:     Rate and Rhythm: Normal rate and regular rhythm.     Pulses: Normal pulses.     Heart sounds: Normal heart sounds. No murmur heard.    No friction rub. No gallop.  Pulmonary:     Effort: Pulmonary effort is normal. No respiratory distress.     Breath sounds: Normal breath sounds. No stridor. No wheezing, rhonchi or rales.  Chest:     Chest wall: No mass, lacerations, deformity, swelling, tenderness or edema.  Breasts:    Tanner Score is 5.     Breasts are symmetrical.     Right: Normal. No swelling, bleeding, inverted nipple, mass, nipple discharge, skin change or tenderness.     Left: Normal. No swelling, bleeding, inverted nipple, mass, nipple discharge, skin change or tenderness.  Abdominal:     General: Bowel sounds are normal. There is no distension.     Palpations: Abdomen is soft. There is no mass.     Tenderness: There is no abdominal tenderness. There is no right CVA tenderness, left CVA tenderness, guarding or rebound.     Hernia: No hernia is present. There is no hernia in the left inguinal area or right inguinal area.  Genitourinary:    General: Normal vulva.     Exam position: Lithotomy position.     Pubic Area: No rash or pubic lice.      Tanner stage (genital): 5.     Labia:        Right: No rash, tenderness, lesion or injury.        Left: No rash, tenderness, lesion or injury.      Urethra: No prolapse, urethral pain, urethral swelling or  urethral lesion.     Vagina: No signs of injury and foreign body. No vaginal discharge, erythema, tenderness, bleeding, lesions or prolapsed vaginal walls.     Cervix: No cervical motion tenderness, discharge, friability, lesion, erythema, cervical bleeding or eversion.     Uterus: Normal. Not enlarged, not fixed, not tender and no uterine prolapse.      Adnexa:        Right: No mass, tenderness or fullness.         Left: No mass, tenderness or fullness.    Musculoskeletal:        General: No swelling, tenderness, deformity or signs of injury.     Cervical  back: Normal range of motion and neck supple. No rigidity or tenderness.     Right lower leg: No edema.     Left lower leg: No edema.  Lymphadenopathy:     Cervical: No cervical adenopathy.     Upper Body:     Right upper body: No supraclavicular, axillary or pectoral adenopathy.     Left upper body: No supraclavicular, axillary or pectoral adenopathy.     Lower Body: No right inguinal adenopathy. No left inguinal adenopathy.  Skin:    General: Skin is warm and dry.     Capillary Refill: Capillary refill takes less than 2 seconds.     Coloration: Skin is not jaundiced or pale.     Findings: No bruising, erythema or rash.     Comments: Lesion consistent with hidradenitis superlativa noted on the bilateral axillary and bilateral inner thigh area.  No redness, swelling discharge noted   Neurological:     Mental Status: She is alert and oriented to person, place, and time.     Cranial Nerves: No cranial nerve deficit.     Sensory: No sensory deficit.     Motor: No weakness.     Coordination: Coordination normal.     Gait: Gait normal.     Deep Tendon Reflexes: Reflexes normal.  Psychiatric:        Mood and Affect: Mood normal.        Behavior: Behavior normal.        Thought Content: Thought content normal.        Judgment: Judgment normal.     No results found for any visits on 09/03/22.     Assessment & Plan:    Routine  Health Maintenance and Physical Exam  Immunization History  Administered Date(s) Administered   PFIZER(Purple Top)SARS-COV-2 Vaccination 12/25/2019, 01/15/2020    Health Maintenance  Topic Date Due   HPV VACCINES (1 - 2-dose series) Never done   CHLAMYDIA SCREENING  Never done   Hepatitis C Screening  Never done   DTaP/Tdap/Td (1 - Tdap) Never done   PAP-Cervical Cytology Screening  Never done   PAP SMEAR-Modifier  Never done   COVID-19 Vaccine (3 - 2023-24 season) 11/13/2021   INFLUENZA VACCINE  10/14/2022   HIV Screening  Completed    Discussed health benefits of physical activity, and encouraged her to engage in regular exercise appropriate for her age and condition.  Problem List Items Addressed This Visit       Respiratory   Seasonal and perennial allergic rhinoconjunctivitis    Well-controlled on cetirizine 10 mg daily, Flonase nasal spray 1 spray 2 times daily as needed Continue current medication Avoid allergens        Musculoskeletal and Integument   Hidradenitis suppurativa    No flareup currently Patient will follow-up with dermatologist        Other   Obesity    Wt Readings from Last 3 Encounters:  09/03/22 277 lb (125.6 kg)  07/14/22 273 lb 6.4 oz (124 kg)  11/11/21 270 lb 6 oz (122.6 kg)  Body mass index is 42.12 kg/m.  Patient has added about 4 pounds since last visit Patient counseled on low-carb modified diet Encouraged to engage in regular moderate to vigorous exercises at least 250 minutes weekly Benefits of healthy weights discussed. Patient referred to the medical weight management clinic      Relevant Orders   Amb Ref to Medical Weight Management   Annual physical exam - Primary  Annual exam as documented.  Counseling done include healthy lifestyle involving committing to 150 minutes of exercise per week, heart healthy diet, and attaining healthy weight. The importance of adequate sleep also discussed.  Regular use of seat belt and  home safety were also discussed . Changes in health habits are decided on by patient with goals and time frames set for achieving them. Immunization and cancer screening  needs are specifically addressed at this visit.    Routine fasting labs ordered Cervical Pap exam completed      Relevant Orders   CBC with Differential/Platelet   CMP14+EGFR   Lipid panel   TSH   Hepatitis C antibody   Other Visit Diagnoses     Screening for cervical cancer       Relevant Orders   Cytology - PAP([facility])   Screening for endocrine, nutritional, metabolic and immunity disorder       Relevant Orders   CBC with Differential/Platelet   CMP14+EGFR   Lipid panel   TSH   Hepatitis C antibody      Return in about 1 year (around 09/03/2023) for CPE.     Donell Beers, FNP

## 2022-09-03 NOTE — Assessment & Plan Note (Addendum)
Wt Readings from Last 3 Encounters:  09/03/22 277 lb (125.6 kg)  07/14/22 273 lb 6.4 oz (124 kg)  11/11/21 270 lb 6 oz (122.6 kg)  Body mass index is 42.12 kg/m.  Patient has added about 4 pounds since last visit Patient counseled on low-carb modified diet Encouraged to engage in regular moderate to vigorous exercises at least 250 minutes weekly Benefits of healthy weights discussed. Patient referred to the medical weight management clinic

## 2022-09-03 NOTE — Assessment & Plan Note (Signed)
Annual exam as documented.  Counseling done include healthy lifestyle involving committing to 150 minutes of exercise per week, heart healthy diet, and attaining healthy weight. The importance of adequate sleep also discussed.  Regular use of seat belt and home safety were also discussed . Changes in health habits are decided on by patient with goals and time frames set for achieving them. Immunization and cancer screening  needs are specifically addressed at this visit.    Routine fasting labs ordered Cervical Pap exam completed

## 2022-09-03 NOTE — Patient Instructions (Signed)

## 2022-09-03 NOTE — Assessment & Plan Note (Signed)
No flareup currently Patient will follow-up with dermatologist

## 2022-09-03 NOTE — Assessment & Plan Note (Signed)
Well-controlled on cetirizine 10 mg daily, Flonase nasal spray 1 spray 2 times daily as needed Continue current medication Avoid allergens

## 2022-09-04 LAB — CBC WITH DIFFERENTIAL/PLATELET
Basophils Absolute: 0 10*3/uL (ref 0.0–0.2)
Basos: 0 %
EOS (ABSOLUTE): 0.1 10*3/uL (ref 0.0–0.4)
Eos: 1 %
Hematocrit: 41.7 % (ref 34.0–46.6)
Hemoglobin: 12.9 g/dL (ref 11.1–15.9)
Immature Grans (Abs): 0 10*3/uL (ref 0.0–0.1)
Immature Granulocytes: 0 %
Lymphocytes Absolute: 2.4 10*3/uL (ref 0.7–3.1)
Lymphs: 35 %
MCH: 26.2 pg — ABNORMAL LOW (ref 26.6–33.0)
MCHC: 30.9 g/dL — ABNORMAL LOW (ref 31.5–35.7)
MCV: 85 fL (ref 79–97)
Monocytes Absolute: 0.5 10*3/uL (ref 0.1–0.9)
Monocytes: 6 %
Neutrophils Absolute: 4 10*3/uL (ref 1.4–7.0)
Neutrophils: 58 %
Platelets: 350 10*3/uL (ref 150–450)
RBC: 4.93 x10E6/uL (ref 3.77–5.28)
RDW: 14.3 % (ref 11.7–15.4)
WBC: 7 10*3/uL (ref 3.4–10.8)

## 2022-09-04 LAB — CMP14+EGFR
ALT: 15 IU/L (ref 0–32)
AST: 12 IU/L (ref 0–40)
Albumin: 4.3 g/dL (ref 4.0–5.0)
Alkaline Phosphatase: 59 IU/L (ref 44–121)
BUN/Creatinine Ratio: 11 (ref 9–23)
BUN: 10 mg/dL (ref 6–20)
Bilirubin Total: 0.4 mg/dL (ref 0.0–1.2)
CO2: 23 mmol/L (ref 20–29)
Calcium: 9.3 mg/dL (ref 8.7–10.2)
Chloride: 101 mmol/L (ref 96–106)
Creatinine, Ser: 0.93 mg/dL (ref 0.57–1.00)
Globulin, Total: 3 g/dL (ref 1.5–4.5)
Glucose: 140 mg/dL — ABNORMAL HIGH (ref 70–99)
Potassium: 4.4 mmol/L (ref 3.5–5.2)
Sodium: 138 mmol/L (ref 134–144)
Total Protein: 7.3 g/dL (ref 6.0–8.5)
eGFR: 89 mL/min/{1.73_m2} (ref >59)

## 2022-09-04 LAB — LIPID PANEL
Chol/HDL Ratio: 4 ratio (ref 0.0–4.4)
Cholesterol, Total: 182 mg/dL (ref 100–199)
HDL: 46 mg/dL (ref >39)
LDL Chol Calc (NIH): 118 mg/dL — ABNORMAL HIGH (ref 0–99)
Triglycerides: 101 mg/dL (ref 0–149)
VLDL Cholesterol Cal: 18 mg/dL (ref 5–40)

## 2022-09-04 LAB — TSH: TSH: 2.05 u[IU]/mL (ref 0.450–4.500)

## 2022-09-04 LAB — HEPATITIS C ANTIBODY: Hep C Virus Ab: NONREACTIVE

## 2022-09-10 ENCOUNTER — Other Ambulatory Visit: Payer: Self-pay | Admitting: Nurse Practitioner

## 2022-09-10 DIAGNOSIS — B3731 Acute candidiasis of vulva and vagina: Secondary | ICD-10-CM

## 2022-09-10 LAB — CYTOLOGY - PAP
Adequacy: ABSENT
Chlamydia: NEGATIVE
Comment: NEGATIVE
Comment: NEGATIVE
Comment: NORMAL
Diagnosis: NEGATIVE
Neisseria Gonorrhea: NEGATIVE
Trichomonas: NEGATIVE

## 2022-09-10 MED ORDER — FLUCONAZOLE 150 MG PO TABS
150.0000 mg | ORAL_TABLET | Freq: Once | ORAL | 0 refills | Status: AC
Start: 2022-09-10 — End: 2022-09-10

## 2023-03-02 ENCOUNTER — Ambulatory Visit: Payer: Commercial Managed Care - PPO | Admitting: Dermatology

## 2023-03-28 ENCOUNTER — Encounter (INDEPENDENT_AMBULATORY_CARE_PROVIDER_SITE_OTHER): Payer: Self-pay | Admitting: Adult Health

## 2023-05-24 ENCOUNTER — Encounter (INDEPENDENT_AMBULATORY_CARE_PROVIDER_SITE_OTHER): Payer: Self-pay

## 2023-07-21 ENCOUNTER — Ambulatory Visit: Payer: Commercial Managed Care - PPO | Admitting: Dermatology

## 2023-07-21 ENCOUNTER — Encounter: Payer: Self-pay | Admitting: Dermatology

## 2023-07-21 VITALS — BP 132/87 | HR 87

## 2023-07-21 DIAGNOSIS — L732 Hidradenitis suppurativa: Secondary | ICD-10-CM | POA: Diagnosis not present

## 2023-07-21 MED ORDER — DOXYCYCLINE HYCLATE 100 MG PO TABS: 100.0000 mg | ORAL_TABLET | Freq: Two times a day (BID) | ORAL | 6 refills | Status: AC

## 2023-07-21 MED ORDER — MUPIROCIN 2 % EX OINT
1.0000 | TOPICAL_OINTMENT | Freq: Two times a day (BID) | CUTANEOUS | 6 refills | Status: AC
Start: 2023-07-21 — End: ?

## 2023-07-21 NOTE — Patient Instructions (Addendum)
 Date: Thu Jul 21 2023  Hello Lynann Sandman,  Thank you for visiting today. Here is a summary of the key instructions:  - Medications:   - Use CeraVe benzoyl peroxide wash on underarms and groin daily   - Apply mupirocin ointment to flares for healing and bacteria control   - Take doxycycline pills at first sign of a tender bump:     - Take twice a day for one week     - Take with food to avoid upset stomach  - Lifestyle Changes:   - Avoid shaving affected areas   - Continue exercising   - Wear sunscreen, especially during your cruise  - Follow-up:   - Return for a follow-up appointment in 3-4 months  - Additional Instructions:   - Purchase benzoyl peroxide wash over-the-counter   - Avoid using benzoyl peroxide wash on the mucosal surface of the vaginal area   - Call the office if the treatment regimen is not working  Please reach out if you have any questions or concerns.  Warm regards,  Dr. Louana Roup [facility]      Important Information   Due to recent changes in healthcare laws, you may see results of your pathology and/or laboratory studies on MyChart before the doctors have had a chance to review them. We understand that in some cases there may be results that are confusing or concerning to you. Please understand that not all results are received at the same time and often the doctors may need to interpret multiple results in order to provide you with the best plan of care or course of treatment. Therefore, we ask that you please give us  2 business days to thoroughly review all your results before contacting the office for clarification. Should we see a critical lab result, you will be contacted sooner.     If You Need Anything After Your Visit   If you have any questions or concerns for your doctor, please call our main line at [phone]. If no one answers, please leave a voicemail as directed and we will return your call as soon as possible. Messages left  after 4 pm will be answered the following business day.    You may also send us  a message via MyChart. We typically respond to MyChart messages within 1-2 business days.  For prescription refills, please ask your pharmacy to contact our office. Our fax number is [phone].  If you have an urgent issue when the clinic is closed that cannot wait until the next business day, you can page your doctor at the number below.     Please note that while we do our best to be available for urgent issues outside of office hours, we are not available 24/7.    If you have an urgent issue and are unable to reach us , you may choose to seek medical care at your doctor's office, retail clinic, urgent care center, or emergency room.   If you have a medical emergency, please immediately call 911 or go to the emergency department. In the event of inclement weather, please call our main line at [phone] for an update on the status of any delays or closures.  Dermatology Medication Tips: Please keep the boxes that topical medications come in in order to help keep track of the instructions about where and how to use these. Pharmacies typically print the medication instructions only on the boxes and not directly on the medication tubes.   If your medication  is too expensive, please contact our office at [phone] or send us  a message through MyChart.    We are unable to tell what your co-pay for medications will be in advance as this is different depending on your insurance coverage. However, we may be able to find a substitute medication at lower cost or fill out paperwork to get insurance to cover a needed medication.    If a prior authorization is required to get your medication covered by your insurance company, please allow us  1-2 business days to complete this process.   Drug prices often vary depending on where the prescription is filled and some pharmacies may offer cheaper prices.   The website  www.goodrx.com contains coupons for medications through different pharmacies. The prices here do not account for what the cost may be with help from insurance (it may be cheaper with your insurance), but the website can give you the price if you did not use any insurance.  - You can print the associated coupon and take it with your prescription to the pharmacy.  - You may also stop by our office during regular business hours and pick up a GoodRx coupon card.  - If you need your prescription sent electronically to a different pharmacy, notify our office through [facility] or by phone at [phone]

## 2023-07-21 NOTE — Progress Notes (Signed)
   New Patient Visit   Subjective  Lisa Brandt is a 23 y.o. female who presents for the following: HS  Patient states she has HS located at the groin and axilla that she would like to have examined. Patient reports the areas have been there for several years. She reports the areas are not bothersome as of today but they can be very painful. She states had a flare about 2 weeks ago. Patient rates irritation 10 out of 10. Patient reports she has previously been treated for these areas. She was prescribe oral clindamycin . Patient reports family history of HS. Patient reports she currently wash with dove daily and she will use dial occasionally. Patient denies knowing of potential triggers.   The following portions of the chart were reviewed this encounter and updated as appropriate: medications, allergies, medical history  Review of Systems:  No other skin or systemic complaints except as noted in HPI or Assessment and Plan.  Objective  Well appearing patient in no apparent distress; mood and affect are within normal limits.  A focused examination was performed of the following areas: B/L Axilla and groin  Relevant exam findings are noted in the Assessment and Plan.    Assessment & Plan   HIDRADENITIS SUPPURATIVA Exam:  Hurley Stage 2: single or multiple, widely separated recurrent abscesses with sinus tract formation and scarring   Not at goal  Hidradenitis Suppurativa is a chronic; persistent; non-curable, but treatable condition due to abnormal inflamed sweat glands in the body folds (axilla, inframammary, groin, medial thighs), causing recurrent painful draining cysts and scarring. It can be associated with severe scarring acne and cysts; also abscesses and scarring of scalp. The goal is control and prevention of flares, as it is not curable. Scars are permanent and can be thickened. Treatment may include daily use of topical medication and oral antibiotics.  Oral isotretinoin may  also be helpful.  For some cases, Humira or Cosentyx (biologic injections) may be prescribed to decrease the inflammatory process and prevent flares.  When indicated, inflamed cysts may also be treated surgically.  Treatment Plan: - Recommended washing with antibacterial wash (Recommended Cerave 10% BPO) - Educated on dietary foods that help aid in anti inflammatory foods - We will prescribe Mupirocin Ointment to apply topically - We will prescribe Doxycycline 100 mg to take 2 times daily for 7 days AT THE FIRST SIGN OF FLARE - We will plan to follow up in 3-4 Months to re-assess - Educated that if topical agents are ineffective we will plan to discuss initiated Humira or Cosentyx  HIDRADENITIS SUPPURATIVA   Related Medications doxycycline (VIBRA-TABS) 100 MG tablet Take 1 tablet (100 mg total) by mouth 2 (two) times daily. TAKE WITH HEAVY MEALS AT THE FIRST SIGN OF A FLARE mupirocin ointment (BACTROBAN) 2 % Apply 1 Application topically 2 (two) times daily. APPLY AT THE FIRST SIGN OF FLARE  Return in about 4 months (around 11/21/2023) for HS F/U.  I, Jetta Ager, am acting as Neurosurgeon for [employer], DO.  Documentation: I have reviewed the above documentation for accuracy and completeness, and I agree with the above.  Louana Roup, DO

## 2023-09-05 ENCOUNTER — Ambulatory Visit: Payer: Self-pay | Admitting: Nurse Practitioner

## 2023-10-17 ENCOUNTER — Encounter: Payer: Self-pay | Admitting: Nurse Practitioner

## 2023-11-23 ENCOUNTER — Ambulatory Visit: Admitting: Dermatology

## 2023-12-21 ENCOUNTER — Encounter: Payer: Self-pay | Admitting: Nurse Practitioner

## 2024-03-19 ENCOUNTER — Encounter: Payer: Self-pay | Admitting: Nurse Practitioner

## 2024-04-19 ENCOUNTER — Ambulatory Visit: Payer: Self-pay | Admitting: Dermatology
